# Patient Record
Sex: Female | Born: 1940 | Race: White | Hispanic: No | Marital: Married
Health system: Southern US, Community
[De-identification: ages and names within clinical notes are randomized; demographics above are authoritative.]

## PROBLEM LIST (undated history)

## (undated) DIAGNOSIS — J939 Pneumothorax, unspecified: Secondary | ICD-10-CM

## (undated) DIAGNOSIS — J9621 Acute and chronic respiratory failure with hypoxia: Secondary | ICD-10-CM

## (undated) DIAGNOSIS — I482 Chronic atrial fibrillation, unspecified: Secondary | ICD-10-CM

## (undated) DIAGNOSIS — I5032 Chronic diastolic (congestive) heart failure: Secondary | ICD-10-CM

## (undated) DIAGNOSIS — I7101 Dissection of ascending aorta: Secondary | ICD-10-CM

---

## 2020-02-09 ENCOUNTER — Inpatient Hospital Stay
Admit: 2020-02-09 | Discharge: 2020-03-09 | Disposition: A | Discharge status: Rehabilitation Facility | Payer: Medicare Other | Source: Other Acute Inpatient Hospital | Attending: Internal Medicine | Admitting: Internal Medicine

## 2020-02-09 DIAGNOSIS — I5032 Chronic diastolic (congestive) heart failure: Secondary | ICD-10-CM | POA: Diagnosis present

## 2020-02-09 DIAGNOSIS — J9690 Respiratory failure, unspecified, unspecified whether with hypoxia or hypercapnia: Secondary | ICD-10-CM

## 2020-02-09 DIAGNOSIS — J9621 Acute and chronic respiratory failure with hypoxia: Secondary | ICD-10-CM | POA: Diagnosis present

## 2020-02-09 DIAGNOSIS — Z431 Encounter for attention to gastrostomy: Secondary | ICD-10-CM

## 2020-02-09 DIAGNOSIS — Z4659 Encounter for fitting and adjustment of other gastrointestinal appliance and device: Secondary | ICD-10-CM

## 2020-02-09 DIAGNOSIS — Z9911 Dependence on respirator [ventilator] status: Secondary | ICD-10-CM

## 2020-02-09 DIAGNOSIS — J90 Pleural effusion, not elsewhere classified: Secondary | ICD-10-CM

## 2020-02-09 DIAGNOSIS — I482 Chronic atrial fibrillation, unspecified: Secondary | ICD-10-CM | POA: Diagnosis present

## 2020-02-09 DIAGNOSIS — T85598A Other mechanical complication of other gastrointestinal prosthetic devices, implants and grafts, initial encounter: Secondary | ICD-10-CM

## 2020-02-09 DIAGNOSIS — J939 Pneumothorax, unspecified: Secondary | ICD-10-CM | POA: Diagnosis present

## 2020-02-09 DIAGNOSIS — J189 Pneumonia, unspecified organism: Secondary | ICD-10-CM

## 2020-02-09 DIAGNOSIS — I7101 Dissection of ascending aorta: Secondary | ICD-10-CM | POA: Diagnosis present

## 2020-02-09 HISTORY — DX: Acute and chronic respiratory failure with hypoxia: J96.21

## 2020-02-09 HISTORY — DX: Chronic diastolic (congestive) heart failure: I50.32

## 2020-02-09 HISTORY — DX: Pneumothorax, unspecified: J93.9

## 2020-02-09 HISTORY — DX: Dissection of thoracic aorta: I71.01

## 2020-02-09 HISTORY — DX: Chronic atrial fibrillation, unspecified: I48.20

## 2020-02-09 HISTORY — DX: Dissection of ascending aorta: I71.010

## 2020-02-10 ENCOUNTER — Other Ambulatory Visit (HOSPITAL_COMMUNITY): Payer: Medicare Other

## 2020-02-10 DIAGNOSIS — I482 Chronic atrial fibrillation, unspecified: Secondary | ICD-10-CM | POA: Diagnosis not present

## 2020-02-10 DIAGNOSIS — I5032 Chronic diastolic (congestive) heart failure: Secondary | ICD-10-CM

## 2020-02-10 DIAGNOSIS — J9621 Acute and chronic respiratory failure with hypoxia: Secondary | ICD-10-CM | POA: Diagnosis not present

## 2020-02-10 DIAGNOSIS — J939 Pneumothorax, unspecified: Secondary | ICD-10-CM

## 2020-02-10 DIAGNOSIS — I7101 Dissection of thoracic aorta: Secondary | ICD-10-CM

## 2020-02-10 LAB — URINALYSIS, ROUTINE W REFLEX MICROSCOPIC
Bilirubin Urine: NEGATIVE
Glucose, UA: NEGATIVE mg/dL
Ketones, ur: NEGATIVE mg/dL
Nitrite: NEGATIVE
Protein, ur: 30 mg/dL — AB
Specific Gravity, Urine: 1.019 (ref 1.005–1.030)
WBC, UA: >50 WBC/hpf — ABNORMAL HIGH (ref 0–5)
pH: 5 (ref 5.0–8.0)

## 2020-02-10 LAB — BASIC METABOLIC PANEL
Anion gap: 15 (ref 5–15)
BUN: 49 mg/dL — ABNORMAL HIGH (ref 8–23)
CO2: 35 mmol/L — ABNORMAL HIGH (ref 22–32)
Calcium: 9 mg/dL (ref 8.9–10.3)
Chloride: 94 mmol/L — ABNORMAL LOW (ref 98–111)
Creatinine, Ser: 0.84 mg/dL (ref 0.44–1.00)
GFR, Estimated: >60 mL/min (ref >60)
Glucose, Bld: 95 mg/dL (ref 70–99)
Potassium: 4.4 mmol/L (ref 3.5–5.1)
Sodium: 144 mmol/L (ref 135–145)

## 2020-02-10 LAB — BLOOD GAS, ARTERIAL
Acid-Base Excess: 11.5 mmol/L — ABNORMAL HIGH (ref 0.0–2.0)
Acid-Base Excess: 14.3 mmol/L — ABNORMAL HIGH (ref 0.0–2.0)
Acid-Base Excess: 13 mmol/L — ABNORMAL HIGH (ref 0.0–2.0)
Acid-Base Excess: 13.3 mmol/L — ABNORMAL HIGH (ref 0.0–2.0)
Bicarbonate: 38.1 mmol/L — ABNORMAL HIGH (ref 20.0–28.0)
Bicarbonate: 38.6 mmol/L — ABNORMAL HIGH (ref 20.0–28.0)
Bicarbonate: 36.5 mmol/L — ABNORMAL HIGH (ref 20.0–28.0)
Bicarbonate: 36.8 mmol/L — ABNORMAL HIGH (ref 20.0–28.0)
FIO2: 80
FIO2: 50
FIO2: 50
FIO2: 50
O2 Saturation: 99.8 %
O2 Saturation: 97.7 %
O2 Saturation: 98.8 %
O2 Saturation: 98.8 %
Patient temperature: 35.6
Patient temperature: 36.8
Patient temperature: 37.6
Patient temperature: 38
pCO2 arterial: 74.3 mmHg (ref 32.0–48.0)
pCO2 arterial: 48.2 mmHg — ABNORMAL HIGH (ref 32.0–48.0)
pCO2 arterial: 41.3 mmHg (ref 32.0–48.0)
pCO2 arterial: 42 mmHg (ref 32.0–48.0)
pH, Arterial: 7.322 — ABNORMAL LOW (ref 7.350–7.450)
pH, Arterial: 7.514 — ABNORMAL HIGH (ref 7.350–7.450)
pH, Arterial: 7.557 — ABNORMAL HIGH (ref 7.350–7.450)
pH, Arterial: 7.555 — ABNORMAL HIGH (ref 7.350–7.450)
pO2, Arterial: 246 mmHg — ABNORMAL HIGH (ref 83.0–108.0)
pO2, Arterial: 76.9 mmHg — ABNORMAL LOW (ref 83.0–108.0)
pO2, Arterial: 99.7 mmHg (ref 83.0–108.0)
pO2, Arterial: 105 mmHg (ref 83.0–108.0)

## 2020-02-10 LAB — TROPONIN I (HIGH SENSITIVITY): Troponin I (High Sensitivity): 32 ng/L — ABNORMAL HIGH (ref <18)

## 2020-02-10 LAB — CBC
HCT: 29.7 % — ABNORMAL LOW (ref 36.0–46.0)
Hemoglobin: 9 g/dL — ABNORMAL LOW (ref 12.0–15.0)
MCH: 29.2 pg (ref 26.0–34.0)
MCHC: 30.3 g/dL (ref 30.0–36.0)
MCV: 96.4 fL (ref 80.0–100.0)
Platelets: 282 10*3/uL (ref 150–400)
RBC: 3.08 MIL/uL — ABNORMAL LOW (ref 3.87–5.11)
RDW: 17.2 % — ABNORMAL HIGH (ref 11.5–15.5)
WBC: 9 10*3/uL (ref 4.0–10.5)
nRBC: 0 % (ref 0.0–0.2)

## 2020-02-10 NOTE — Consult Note (Signed)
Pulmonary Critical Care Medicine [facility] [place]  PULMONARY SERVICE  Date of Service: 02/10/2020  PULMONARY CRITICAL CARE CONSULT   Kristine Herrera  XBD:[id number]  DOB: 07/18/1940   DOA: 02/09/2020  Referring Physician: Carron Curie, MD  HPI: Kristine Herrera is a 80 y.o. female seen for follow up of Acute on Chronic Respiratory Failure.  Patient has multiple medical problems including atrial fibrillation pneumothorax acute renal failure hypertension came into the hospital after complaint of syncope.  Patient was evaluated and found to have a type a dissection of the aorta.  Patient underwent repair on December 8 postoperatively had a very complicated course by development of cardiogenic shock requiring ongoing vasopressors.  Patient was not able to come off of the ventilator he was noted to have pleural effusions and edema and pneumothorax requiring a chest tube.  Subsequently patient had a tracheostomy done for anticipated prolonged mechanical ventilation.  Other complications included development of atrial fibrillation which is now rate controlled.  She is transferred to our facility for further management and weaning.  Review of Systems:  ROS performed and is unremarkable other than noted above.  Past medical history: Hypertension Delirium Aortic dissection Pleural effusions Pneumothorax Atrial fibrillation  Past surgical history: Aortic repair Tracheostomy  Social history unknown tobacco alcohol or drug abuse  Family history: Noncontributory  Medications: Reviewed on Rounds  Physical Exam:  Vitals: Temperature 98.2 pulse 78 respiratory rate 21 blood pressure is 121/59 saturations 99%  Ventilator Settings currently on assist control FiO2 50% tidal volume 500 PEEP of 5  . General: Comfortable at this time . Eyes: Grossly normal lids, irises & conjunctiva . ENT: grossly tongue is normal . Neck: no obvious mass . Cardiovascular: S1-S2 normal no gallop or  rub . Respiratory: No rhonchi no rales noted at this time . Abdomen: Soft and nontender . Skin: no rash seen on limited exam . Musculoskeletal: not rigid . Psychiatric:unable to assess . Neurologic: no seizure no involuntary movements         Labs on Admission:  Basic Metabolic Panel: Recent Labs  Lab 02/10/20 0541  NA 144  K 4.4  CL 94*  CO2 35*  GLUCOSE 95  BUN 49*  CREATININE 0.84  CALCIUM 9.0    Recent Labs  Lab 02/10/20 0330 02/10/20 0550 02/10/20 0834 02/10/20 1407  PHART 7.322* 7.514* 7.557* 7.555*  PCO2ART 74.3* 48.2* 41.3 42.0  PO2ART 246* 76.9* 99.7 105  HCO3 38.1* 38.6* 36.5* 36.8*  O2SAT 99.8 97.7 98.8 98.8    Liver Function Tests: No results for input(s): AST, ALT, ALKPHOS, BILITOT, PROT, ALBUMIN in the last 168 hours. No results for input(s): LIPASE, AMYLASE in the last 168 hours. No results for input(s): AMMONIA in the last 168 hours.  CBC: Recent Labs  Lab 02/10/20 0541  WBC 9.0  HGB 9.0*  HCT 29.7*  MCV 96.4  PLT 282    Cardiac Enzymes: No results for input(s): CKTOTAL, CKMB, CKMBINDEX, TROPONINI in the last 168 hours.  BNP (last 3 results) No results for input(s): BNP in the last 8760 hours.  ProBNP (last 3 results) No results for input(s): PROBNP in the last 8760 hours.   Radiological Exams on Admission: DG Chest 1 View  Result Date: 02/10/2020 CLINICAL DATA:  Respiratory failure, decreased oxygen saturation EXAM: CHEST  1 VIEW COMPARISON:  None. FINDINGS: Single frontal view of the chest demonstrates tracheostomy tube overlying tracheal air column tip at thoracic inlet. Enteric catheter passes below diaphragm tip excluded by collimation.  The cardiac silhouette is enlarged. There is central vascular congestion, with right basilar veiling opacity consistent with consolidation and/or effusion. No pneumothorax. No acute bony abnormalities. IMPRESSION: 1. Findings consistent with congestive heart failure, with asymmetric right greater  than left edema and effusions. Electronically Signed   By: Sharlet Salina M.D.   On: 02/10/2020 03:20   DG Abd Portable 1V  Result Date: 02/10/2020 CLINICAL DATA:  Enteric catheter placement EXAM: PORTABLE ABDOMEN - 1 VIEW COMPARISON:  None. FINDINGS: Supine frontal view of the abdomen and pelvis excludes the hemidiaphragms by collimation. Enteric catheter coiled over the the distal stomach, tip in the region of the gastric antrum or proximal duodenum. Bowel gas pattern is unremarkable without obstruction or ileus. There are no masses or abnormal calcifications. IMPRESSION: 1. Enteric catheter tip projecting over the gastric antrum or duodenal bulb. 2. Unremarkable bowel gas pattern. Electronically Signed   By: Sharlet Salina M.D.   On: 02/10/2020 01:31    Assessment/Plan Active Problems:   Acute on chronic respiratory failure with hypoxia (HCC)   Chronic atrial fibrillation (HCC)   Pneumothorax   Chronic heart failure with preserved ejection fraction (HCC)   Ascending aortic dissection (HCC)   1. Acute on chronic respiratory failure with hypoxia we are holding the wean today patient apparently had a rapid response early on cardiology has been asked to see the patient for atrial fibrillation. 2. Atrial fibrillation with RVR cardiology asked to see the patient will follow up on their recommendations 3. Aortic dissection status postrepair 4. Pneumothorax resolved patient had chest tube placement 5. Congestive heart failure monitor fluid status will need diuresis  I have personally seen and evaluated the patient, evaluated laboratory and imaging results, formulated the assessment and plan and placed orders. The Patient requires high complexity decision making with multiple systems involvement.  Case was discussed on Rounds with the Respiratory Therapy Director and the Respiratory staff Time Spent  Yevonne Pax, MD [facility] Pulmonary Critical Care Medicine Sleep Medicine

## 2020-02-10 NOTE — Consult Note (Signed)
Referring Physician: Dr. Farrel Gordon  Kristine Herrera is an 80 y.o. female.                       Chief Complaint: Atiral fibrillation with RVR  HPI: 80 years old female had syncope, T1 burst fracture, nasal fractures and type A aortic dissection on 01/12/2020. She had emergent repair of type A aortic dissection. Post surgery she had cardiogenic shock requiring intubation followed by tracheostomy placed on 01/21/2020. She had atrial fibrillation with RVR and responded to amiodarone and metoprolol use.  Currently she is back in Sinus rhythm with frequent APCs.  She is on trach collar with 50 % FiO2 and 18 AV, 500 cc TV and 5 PEEP. Anticoagulation was on hold per cardiothoracic surgery.  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  Past surgical history: Cholecystectomy. 01/12/2020-Replacement of ascending aorta and hemiarch with re-suspension of AV, Clipping of LA appendage. Tracheostomy-01/21/2020.   The histories are not reviewed yet. Please review them in the "History" navigator section and refresh this SmartLink.  No family history on file. Social History:  has no history on file for tobacco use, alcohol use, and drug use.  Allergies: None  No medications prior to admission.  Amiodarone, metoprolol, aspirin, melatonin.   Results for orders placed or performed during the hospital encounter of 02/09/20 (from the past 48 hour(s))  Blood gas, arterial     Status: Abnormal   Collection Time: 02/10/20  3:30 AM  Result Value Ref Range   FIO2 80.00    pH, Arterial 7.322 (L) 7.350 - 7.450   pCO2 arterial 74.3 (HH) 32.0 - 48.0 mmHg    Comment: CRITICAL RESULT CALLED TO, READ BACK BY AND VERIFIED WITH: C HERNADEZ,RN 02/10/2020 0346 WILDERK    pO2, Arterial 246 (H) 83.0 - 108.0 mmHg   Bicarbonate 38.1 (H) 20.0 - 28.0 mmol/L   Acid-Base Excess 11.5 (H) 0.0 - 2.0 mmol/L   O2 Saturation 99.8  %   Patient temperature 35.6    Collection site RIGHT RADIAL    Drawn by DRAWN BY RN     Comment: C HE   Sample type ARTERIAL DRAW    Allens test (pass/fail) PASS PASS    Comment: Performed at [facility], [address] [place], [place] [address]  CBC     Status: Abnormal   Collection Time: 02/10/20  5:41 AM  Result Value Ref Range   WBC 9.0 4.0 - 10.5 K/uL   RBC 3.08 (L) 3.87 - 5.11 MIL/uL   Hemoglobin 9.0 (L) 12.0 - 15.0 g/dL   HCT 19.1 (L) 47.8 - 29.5 %   MCV 96.4 80.0 - 100.0 fL   MCH 29.2 26.0 - 34.0 pg   MCHC 30.3 30.0 - 36.0 g/dL   RDW 62.1 (H) 30.8 - 65.7 %   Platelets 282 150 - 400 K/uL   nRBC 0.0 0.0 - 0.2 %    Comment: Performed at [facility], [address], [place], [place] [address]  Basic metabolic panel     Status: Abnormal   Collection Time: 02/10/20  5:41 AM  Result Value Ref Range   Sodium 144 135 - 145 mmol/L   Potassium 4.4 3.5 - 5.1 mmol/L   Chloride 94 (L) 98 - 111 mmol/L   CO2 35 (H) 22 - 32 mmol/L   Glucose, Bld 95 70 - 99 mg/dL  Comment: Glucose reference range applies only to samples taken after fasting for at least 8 hours.   BUN 49 (H) 8 - 23 mg/dL   Creatinine, Ser 3.66 0.44 - 1.00 mg/dL   Calcium 9.0 8.9 - 44.0 mg/dL   GFR, Estimated >34 >74 mL/min    Comment: (NOTE) Calculated using the CKD-EPI Creatinine Equation (2021)    Anion gap 15 5 - 15    Comment: Performed at [facility], [address], [place], [place] [address]  Troponin I (High Sensitivity)     Status: Abnormal   Collection Time: 02/10/20  5:41 AM  Result Value Ref Range   Troponin I (High Sensitivity) 32 (H) <18 ng/L    Comment: (NOTE) Elevated high sensitivity troponin I (hsTnI) values and significant  changes across serial measurements may suggest ACS but many other  chronic and acute conditions are known to elevate hsTnI results.  Refer to the "Links" section for chest pain algorithms and additional  guidance. Performed at [facility]  [facility], [address] [place], [place] [address]   Blood gas, arterial     Status: Abnormal   Collection Time: 02/10/20  5:50 AM  Result Value Ref Range   FIO2 50.00    pH, Arterial 7.514 (H) 7.350 - 7.450   pCO2 arterial 48.2 (H) 32.0 - 48.0 mmHg   pO2, Arterial 76.9 (L) 83.0 - 108.0 mmHg   Bicarbonate 38.6 (H) 20.0 - 28.0 mmol/L   Acid-Base Excess 14.3 (H) 0.0 - 2.0 mmol/L   O2 Saturation 97.7 %   Patient temperature 36.8    Collection site RIGHT RADIAL    Drawn by C. HERNANDEZ, RCP    Sample type ARTERIAL DRAW    Allens test (pass/fail) PASS PASS    Comment: Performed at [facility], [address], [place], [place] [address]  Blood gas, arterial     Status: Abnormal   Collection Time: 02/10/20  8:34 AM  Result Value Ref Range   FIO2 50.00    pH, Arterial 7.557 (H) 7.350 - 7.450   pCO2 arterial 41.3 32.0 - 48.0 mmHg   pO2, Arterial 99.7 83.0 - 108.0 mmHg   Bicarbonate 36.5 (H) 20.0 - 28.0 mmol/L   Acid-Base Excess 13.0 (H) 0.0 - 2.0 mmol/L   O2 Saturation 98.8 %   Patient temperature 37.6    Collection site RIGHT RADIAL    Drawn by COLLECTED BY RT     Comment: M.MANUEL,RT   Sample type ARTERIAL DRAW    Allens test (pass/fail) PASS PASS    Comment: Performed at [facility], [address], [place], [place] [address]   DG Chest 1 View  Result Date: 02/10/2020 CLINICAL DATA:  Respiratory failure, decreased oxygen saturation EXAM: CHEST  1 VIEW COMPARISON:  None. FINDINGS: Single frontal view of the chest demonstrates tracheostomy tube overlying tracheal air column tip at thoracic inlet. Enteric catheter passes below diaphragm tip excluded by collimation. The cardiac silhouette is enlarged. There is central vascular congestion, with right basilar veiling opacity consistent with consolidation and/or effusion. No pneumothorax. No acute bony abnormalities. IMPRESSION: 1. Findings consistent with congestive heart failure, with asymmetric right greater than left edema and  effusions. Electronically Signed   By: Sharlet Salina M.D.   On: 02/10/2020 03:20   DG Abd Portable 1V  Result Date: 02/10/2020 CLINICAL DATA:  Enteric catheter placement EXAM: PORTABLE ABDOMEN - 1 VIEW COMPARISON:  None. FINDINGS: Supine frontal view of the abdomen  and pelvis excludes the hemidiaphragms by collimation. Enteric catheter coiled over the the distal stomach, tip in the region of the gastric antrum or proximal duodenum. Bowel gas pattern is unremarkable without obstruction or ileus. There are no masses or abnormal calcifications. IMPRESSION: 1. Enteric catheter tip projecting over the gastric antrum or duodenal bulb. 2. Unremarkable bowel gas pattern. Electronically Signed   By: Randa Ngo M.D.   On: 02/10/2020 01:31    Review Of Systems As per HPI  P: 83, R: 22, BP: 123/44. O2 sat 100 % on 50 % FiO2, 500 TV, 18 A/C and 5 PEEP. There is no height or weight on file to calculate BMI. General appearance: awake, cooperative, appears stated age and mild respiratory distress Head: Normocephalic, atraumatic. Eyes: Blue eyes, pale pink conjunctiva, corneas clear.  Neck: No adenopathy, no carotid bruit, no JVD, supple, symmetrical, tracheostomy in place. Resp: Clearing to auscultation bilaterally. Cardio: Irregular rate and rhythm, S1, S2 normal, II/VI systolic murmur, no click, rub or gallop GI: Soft, non-tender; bowel sounds normal; no organomegaly. Extremities: No edema, cyanosis or clubbing. Left 1st, 2nd, 3rd and 5th toe tips with gangrene. Skin: Warm and dry.  Neurologic: Alert and oriented X 1.  Assessment/Plan Acute on chronic respiratory failure with hypoxia Paroxysmal atrial fibrillation with RVR, [id number] score of 6 S/P type A aortic dissection with repair S/P LA appendage clip HTN Hyperlipidemia Nasal fracture T1 fracture PVD with Left foot toes gangrene  Continue amiodarone and low dose metoprolol for heart rate control. Echocardiogram for LV function. Accept  low dose aspirin and possibly add Plavix for antiplatelets therapy if full anticoagulation is not safe.  Time spent: Review of old records, Lab, x-rays, EKG, other cardiac tests, examination, discussion with patient over 70 minutes.  Birdie Riddle, MD  02/10/2020, 9:50 AM

## 2020-02-11 ENCOUNTER — Encounter: Payer: Self-pay | Admitting: Internal Medicine

## 2020-02-11 ENCOUNTER — Other Ambulatory Visit (HOSPITAL_COMMUNITY): Payer: Medicare Other

## 2020-02-11 DIAGNOSIS — I482 Chronic atrial fibrillation, unspecified: Secondary | ICD-10-CM | POA: Diagnosis not present

## 2020-02-11 DIAGNOSIS — I5032 Chronic diastolic (congestive) heart failure: Secondary | ICD-10-CM | POA: Diagnosis present

## 2020-02-11 DIAGNOSIS — J9621 Acute and chronic respiratory failure with hypoxia: Secondary | ICD-10-CM | POA: Diagnosis not present

## 2020-02-11 DIAGNOSIS — I7101 Dissection of ascending aorta: Secondary | ICD-10-CM | POA: Diagnosis present

## 2020-02-11 DIAGNOSIS — J939 Pneumothorax, unspecified: Secondary | ICD-10-CM | POA: Diagnosis present

## 2020-02-11 LAB — COMPREHENSIVE METABOLIC PANEL
ALT: 26 U/L (ref 0–44)
AST: 27 U/L (ref 15–41)
Albumin: 2.6 g/dL — ABNORMAL LOW (ref 3.5–5.0)
Alkaline Phosphatase: 81 U/L (ref 38–126)
Anion gap: 13 (ref 5–15)
BUN: 50 mg/dL — ABNORMAL HIGH (ref 8–23)
CO2: 35 mmol/L — ABNORMAL HIGH (ref 22–32)
Calcium: 8.6 mg/dL — ABNORMAL LOW (ref 8.9–10.3)
Chloride: 96 mmol/L — ABNORMAL LOW (ref 98–111)
Creatinine, Ser: 0.89 mg/dL (ref 0.44–1.00)
GFR, Estimated: >60 mL/min (ref >60)
Glucose, Bld: 160 mg/dL — ABNORMAL HIGH (ref 70–99)
Potassium: 2.8 mmol/L — ABNORMAL LOW (ref 3.5–5.1)
Sodium: 144 mmol/L (ref 135–145)
Total Bilirubin: 1.1 mg/dL (ref 0.3–1.2)
Total Protein: 6.6 g/dL (ref 6.5–8.1)

## 2020-02-11 LAB — TSH: TSH: 6.82 u[IU]/mL — ABNORMAL HIGH (ref 0.350–4.500)

## 2020-02-11 LAB — CBC
HCT: 26.6 % — ABNORMAL LOW (ref 36.0–46.0)
Hemoglobin: 8.3 g/dL — ABNORMAL LOW (ref 12.0–15.0)
MCH: 29.1 pg (ref 26.0–34.0)
MCHC: 31.2 g/dL (ref 30.0–36.0)
MCV: 93.3 fL (ref 80.0–100.0)
Platelets: 286 10*3/uL (ref 150–400)
RBC: 2.85 MIL/uL — ABNORMAL LOW (ref 3.87–5.11)
RDW: 18.1 % — ABNORMAL HIGH (ref 11.5–15.5)
WBC: 8.9 10*3/uL (ref 4.0–10.5)
nRBC: 0 % (ref 0.0–0.2)

## 2020-02-11 LAB — MAGNESIUM: Magnesium: 2.5 mg/dL — ABNORMAL HIGH (ref 1.7–2.4)

## 2020-02-11 LAB — ECHOCARDIOGRAM COMPLETE
Area-P 1/2: 4.68 cm2
P 1/2 time: 309 msec
S' Lateral: 2.6 cm

## 2020-02-11 LAB — BRAIN NATRIURETIC PEPTIDE: B Natriuretic Peptide: 254.8 pg/mL — ABNORMAL HIGH (ref 0.0–100.0)

## 2020-02-11 LAB — HEMOGLOBIN A1C
Hgb A1c MFr Bld: 5.6 % (ref 4.8–5.6)
Mean Plasma Glucose: 114.02 mg/dL

## 2020-02-11 LAB — T4, FREE: Free T4: 1.24 ng/dL — ABNORMAL HIGH (ref 0.61–1.12)

## 2020-02-11 LAB — PHOSPHORUS: Phosphorus: 1.9 mg/dL — ABNORMAL LOW (ref 2.5–4.6)

## 2020-02-11 NOTE — Progress Notes (Signed)
Pulmonary Critical Care Medicine [facility] [place]   PULMONARY CRITICAL CARE SERVICE  PROGRESS NOTE  Date of Service: 02/11/2020  Kristine Herrera  ONG:[id number]  DOB: 03-Aug-1940   DOA: 02/09/2020  Referring Physician: Carron Curie, MD  HPI: Kristine Herrera is a 80 y.o. female seen for follow up of Acute on Chronic Respiratory Failure.  Patient currently is on pressure support has been on 40% of that to pressure of 12 5  Medications: Reviewed on Rounds  Physical Exam:  Vitals: Temperature is 98.7 pulse 69 history 20 blood pressure is 129/70 saturations 97%  Ventilator Settings currently on pressure support FiO2 is 40% pressure of 12/5  . General: Comfortable at this time . Eyes: Grossly normal lids, irises & conjunctiva . ENT: grossly tongue is normal . Neck: no obvious mass . Cardiovascular: S1 S2 normal no gallop . Respiratory: No rhonchi very coarse breath sounds . Abdomen: soft . Skin: no rash seen on limited exam . Musculoskeletal: not rigid . Psychiatric:unable to assess . Neurologic: no seizure no involuntary movements         Lab Data:   Basic Metabolic Panel: Recent Labs  Lab 02/10/20 0541 02/11/20 0342  NA 144 144  K 4.4 2.8*  CL 94* 96*  CO2 35* 35*  GLUCOSE 95 160*  BUN 49* 50*  CREATININE 0.84 0.89  CALCIUM 9.0 8.6*  MG  --  2.5*  PHOS  --  1.9*    ABG: Recent Labs  Lab 02/10/20 0330 02/10/20 0550 02/10/20 0834 02/10/20 1407  PHART 7.322* 7.514* 7.557* 7.555*  PCO2ART 74.3* 48.2* 41.3 42.0  PO2ART 246* 76.9* 99.7 105  HCO3 38.1* 38.6* 36.5* 36.8*  O2SAT 99.8 97.7 98.8 98.8    Liver Function Tests: Recent Labs  Lab 02/11/20 0342  AST 27  ALT 26  ALKPHOS 81  BILITOT 1.1  PROT 6.6  ALBUMIN 2.6*   No results for input(s): LIPASE, AMYLASE in the last 168 hours. No results for input(s): AMMONIA in the last 168 hours.  CBC: Recent Labs  Lab 02/10/20 0541 02/11/20 0342  WBC 9.0 8.9  HGB 9.0* 8.3*  HCT 29.7*  26.6*  MCV 96.4 93.3  PLT 282 286    Cardiac Enzymes: No results for input(s): CKTOTAL, CKMB, CKMBINDEX, TROPONINI in the last 168 hours.  BNP (last 3 results) Recent Labs    02/11/20 0342  BNP 254.8*    ProBNP (last 3 results) No results for input(s): PROBNP in the last 8760 hours.  Radiological Exams: DG Chest 1 View  Result Date: 02/10/2020 CLINICAL DATA:  Respiratory failure, decreased oxygen saturation EXAM: CHEST  1 VIEW COMPARISON:  None. FINDINGS: Single frontal view of the chest demonstrates tracheostomy tube overlying tracheal air column tip at thoracic inlet. Enteric catheter passes below diaphragm tip excluded by collimation. The cardiac silhouette is enlarged. There is central vascular congestion, with right basilar veiling opacity consistent with consolidation and/or effusion. No pneumothorax. No acute bony abnormalities. IMPRESSION: 1. Findings consistent with congestive heart failure, with asymmetric right greater than left edema and effusions. Electronically Signed   By: Sharlet Salina M.D.   On: 02/10/2020 03:20   DG Abd Portable 1V  Result Date: 02/10/2020 CLINICAL DATA:  Enteric catheter placement EXAM: PORTABLE ABDOMEN - 1 VIEW COMPARISON:  None. FINDINGS: Supine frontal view of the abdomen and pelvis excludes the hemidiaphragms by collimation. Enteric catheter coiled over the the distal stomach, tip in the region of the gastric antrum or proximal duodenum. Bowel gas  pattern is unremarkable without obstruction or ileus. There are no masses or abnormal calcifications. IMPRESSION: 1. Enteric catheter tip projecting over the gastric antrum or duodenal bulb. 2. Unremarkable bowel gas pattern. Electronically Signed   By: Sharlet Salina M.D.   On: 02/10/2020 01:31    Assessment/Plan Active Problems:   Acute on chronic respiratory failure with hypoxia (HCC)   Chronic atrial fibrillation (HCC)   Pneumothorax   Chronic heart failure with preserved ejection fraction (HCC)    Ascending aortic dissection (HCC)   1. Acute on chronic respiratory failure hypoxia plan is to continue with pressure support titrate oxygen continue pulmonary toilet. 2. Chronic atrial fibrillation rate 3. Rate is controlled we will continue to follow 4. Pneumothorax resolved 5. Chronic heart failure monitor fluid status 6. Ascending aortic dissection status postrepair   I have personally seen and evaluated the patient, evaluated laboratory and imaging results, formulated the assessment and plan and placed orders. The Patient requires high complexity decision making with multiple systems involvement.  Rounds were done with the Respiratory Therapy Director and Staff therapists and discussed with nursing staff also.  Yevonne Pax, MD [facility] Pulmonary Critical Care Medicine Sleep Medicine

## 2020-02-11 NOTE — Progress Notes (Signed)
  Echocardiogram 2D Echocardiogram has been performed.  Kristine Herrera 02/11/2020, 3:30 PM

## 2020-02-12 ENCOUNTER — Other Ambulatory Visit (HOSPITAL_COMMUNITY): Payer: Medicare Other

## 2020-02-12 DIAGNOSIS — I7101 Dissection of thoracic aorta: Secondary | ICD-10-CM | POA: Diagnosis not present

## 2020-02-12 DIAGNOSIS — I5032 Chronic diastolic (congestive) heart failure: Secondary | ICD-10-CM | POA: Diagnosis not present

## 2020-02-12 DIAGNOSIS — I482 Chronic atrial fibrillation, unspecified: Secondary | ICD-10-CM | POA: Diagnosis not present

## 2020-02-12 DIAGNOSIS — J9621 Acute and chronic respiratory failure with hypoxia: Secondary | ICD-10-CM | POA: Diagnosis not present

## 2020-02-12 LAB — CBC
HCT: 28 % — ABNORMAL LOW (ref 36.0–46.0)
Hemoglobin: 8.3 g/dL — ABNORMAL LOW (ref 12.0–15.0)
MCH: 28.4 pg (ref 26.0–34.0)
MCHC: 29.6 g/dL — ABNORMAL LOW (ref 30.0–36.0)
MCV: 95.9 fL (ref 80.0–100.0)
Platelets: 230 10*3/uL (ref 150–400)
RBC: 2.92 MIL/uL — ABNORMAL LOW (ref 3.87–5.11)
RDW: 18.6 % — ABNORMAL HIGH (ref 11.5–15.5)
WBC: 10 10*3/uL (ref 4.0–10.5)
nRBC: 0 % (ref 0.0–0.2)

## 2020-02-12 LAB — BASIC METABOLIC PANEL
Anion gap: 11 (ref 5–15)
BUN: 41 mg/dL — ABNORMAL HIGH (ref 8–23)
CO2: 34 mmol/L — ABNORMAL HIGH (ref 22–32)
Calcium: 8.2 mg/dL — ABNORMAL LOW (ref 8.9–10.3)
Chloride: 99 mmol/L (ref 98–111)
Creatinine, Ser: 0.68 mg/dL (ref 0.44–1.00)
GFR, Estimated: >60 mL/min (ref >60)
Glucose, Bld: 149 mg/dL — ABNORMAL HIGH (ref 70–99)
Potassium: 3.3 mmol/L — ABNORMAL LOW (ref 3.5–5.1)
Sodium: 144 mmol/L (ref 135–145)

## 2020-02-12 LAB — CULTURE, RESPIRATORY W GRAM STAIN: Culture: NORMAL

## 2020-02-12 LAB — PHOSPHORUS: Phosphorus: 2.4 mg/dL — ABNORMAL LOW (ref 2.5–4.6)

## 2020-02-12 LAB — MAGNESIUM: Magnesium: 2.4 mg/dL (ref 1.7–2.4)

## 2020-02-12 NOTE — Progress Notes (Signed)
Pulmonary Critical Care Medicine [facility] [place]   PULMONARY CRITICAL CARE SERVICE  PROGRESS NOTE  Date of Service: 02/12/2020  DORENE BRUNI  URK:[id number]  DOB: [birth year]   DOA: 02/09/2020  Referring Physician: Carron Curie, MD  HPI: Kristine Herrera is a 80 y.o. female seen for follow up of Acute on Chronic Respiratory Failure.  Patient is on full support on assist control mode right now on 40% FiO2 yesterday patient was able to do 11 hours with pressure support weaning  Medications: Reviewed on Rounds  Physical Exam:  Vitals: Temperature is 98.5 pulse 71 respiratory rate 28 blood pressure is 140/74 saturations 100%  Ventilator Settings on assist control FiO2 40% tidal volume 450 PEEP 5  . General: Comfortable at this time . Eyes: Grossly normal lids, irises & conjunctiva . ENT: grossly tongue is normal . Neck: no obvious mass . Cardiovascular: S1 S2 normal no gallop . Respiratory: Coarse rhonchi expansion is equal . Abdomen: soft . Skin: no rash seen on limited exam . Musculoskeletal: not rigid . Psychiatric:unable to assess . Neurologic: no seizure no involuntary movements         Lab Data:   Basic Metabolic Panel: Recent Labs  Lab 02/10/20 0541 02/11/20 0342 02/12/20 0513  NA 144 144 144  K 4.4 2.8* 3.3*  CL 94* 96* 99  CO2 35* 35* 34*  GLUCOSE 95 160* 149*  BUN 49* 50* 41*  CREATININE 0.84 0.89 0.68  CALCIUM 9.0 8.6* 8.2*  MG  --  2.5* 2.4  PHOS  --  1.9* 2.4*    ABG: Recent Labs  Lab 02/10/20 0330 02/10/20 0550 02/10/20 0834 02/10/20 1407  PHART 7.322* 7.514* 7.557* 7.555*  PCO2ART 74.3* 48.2* 41.3 42.0  PO2ART 246* 76.9* 99.7 105  HCO3 38.1* 38.6* 36.5* 36.8*  O2SAT 99.8 97.7 98.8 98.8    Liver Function Tests: Recent Labs  Lab 02/11/20 0342  AST 27  ALT 26  ALKPHOS 81  BILITOT 1.1  PROT 6.6  ALBUMIN 2.6*   No results for input(s): LIPASE, AMYLASE in the last 168 hours. No results for input(s): AMMONIA in the  last 168 hours.  CBC: Recent Labs  Lab 02/10/20 0541 02/11/20 0342 02/12/20 0513  WBC 9.0 8.9 10.0  HGB 9.0* 8.3* 8.3*  HCT 29.7* 26.6* 28.0*  MCV 96.4 93.3 95.9  PLT 282 286 230    Cardiac Enzymes: No results for input(s): CKTOTAL, CKMB, CKMBINDEX, TROPONINI in the last 168 hours.  BNP (last 3 results) Recent Labs    02/11/20 0342  BNP 254.8*    ProBNP (last 3 results) No results for input(s): PROBNP in the last 8760 hours.  Radiological Exams: DG CHEST PORT 1 VIEW  Result Date: 02/12/2020 CLINICAL DATA:  Pneumonia EXAM: PORTABLE CHEST 1 VIEW COMPARISON:  02/10/2020 FINDINGS: Tracheostomy and nasoenteric feeding tube extending into the upper abdomen beyond the margin of the examination are unchanged. Pulmonary insufflation is stable. Small right pleural effusion is again seen. Progressive left lower lobe collapse with resultant retrocardiac opacification noted. There is progressive diffuse pulmonary infiltrate, infection versus edema. IMPRESSION: Progressive left lower lobe collapse. Progressive diffuse pulmonary infiltrate, infection versus edema. Stable small right pleural effusion. Electronically Signed   By: Helyn Numbers MD   On: 02/12/2020 06:24   ECHOCARDIOGRAM COMPLETE  Result Date: 02/11/2020    ECHOCARDIOGRAM REPORT   Patient Name:   JORDANN GRIME Panozzo Date of Exam: 02/11/2020 Medical Rec #:  [id number]  Height: Accession #:    [id number]       Weight: Date of Birth:  09-20-1940        BSA: Patient Age:    4 years         BP:           129/70 mmHg Patient Gender: F                HR:           69 bpm. Exam Location:  Inpatient Procedure: 2D Echo, Cardiac Doppler and Color Doppler Indications:     CHF, atrial fibrillation  History:         Patient has no prior history of Echocardiogram examinations.                  CHF, Arrythmias:Atrial Fibrillation, Signs/Symptoms:Resp.                  failure; Risk Factors:Diabetes, Hypertension and Dyslipidemia.                   Type A aortic dissection repair, Trach collar.  Sonographer:     Dustin Flock RDCS Referring Phys:  [place] Diagnosing Phys: Dixie Dials MD IMPRESSIONS  1. Left ventricular ejection fraction, by estimation, is 60 to 65%. The left ventricle has normal function. The left ventricle has no regional wall motion abnormalities. There is mild concentric left ventricular hypertrophy. Left ventricular diastolic parameters are indeterminate.  2. Right ventricular systolic function is low normal. The right ventricular size is normal. There is mildly elevated pulmonary artery systolic pressure.  3. Left atrial size was moderately dilated.  4. Right atrial size was mildly dilated.  5. The mitral valve is degenerative. Mild mitral valve regurgitation.  6. Tricuspid valve regurgitation is moderate.  7. The aortic valve is tricuspid. There is mild calcification of the aortic valve. There is mild thickening of the aortic valve. Aortic valve regurgitation is mild.  8. The inferior vena cava is dilated in size with <50% respiratory variability, suggesting right atrial pressure of 15 mmHg. FINDINGS  Left Ventricle: Left ventricular ejection fraction, by estimation, is 60 to 65%. The left ventricle has normal function. The left ventricle has no regional wall motion abnormalities. The left ventricular internal cavity size was normal in size. There is  mild concentric left ventricular hypertrophy. Left ventricular diastolic parameters are indeterminate. Right Ventricle: The right ventricular size is normal. No increase in right ventricular wall thickness. Right ventricular systolic function is low normal. There is mildly elevated pulmonary artery systolic pressure. The tricuspid regurgitant velocity is 3.11 m/s, and with an assumed right atrial pressure of 3 mmHg, the estimated right ventricular systolic pressure is 56.3 mmHg. Left Atrium: Left atrial size was moderately dilated. Right Atrium: Right atrial size was mildly  dilated. Pericardium: There is no evidence of pericardial effusion. Mitral Valve: The mitral valve is degenerative in appearance. There is mild thickening of the mitral valve leaflet(s). There is mild calcification of the mitral valve leaflet(s). Mild mitral annular calcification. Mild mitral valve regurgitation. Tricuspid Valve: The tricuspid valve is normal in structure. Tricuspid valve regurgitation is moderate. Aortic Valve: The aortic valve is tricuspid. There is mild calcification of the aortic valve. There is mild thickening of the aortic valve. There is mild aortic valve annular calcification. Aortic valve regurgitation is mild. Aortic regurgitation PHT measures 309 msec. Pulmonic Valve: The pulmonic valve was normal in structure. Pulmonic valve regurgitation is mild. Aorta: The  aortic root is normal in size and structure. There is minimal (Grade I) atheroma plaque involving the aortic root and ascending aorta. Venous: The inferior vena cava is dilated in size with less than 50% respiratory variability, suggesting right atrial pressure of 15 mmHg. IAS/Shunts: The interatrial septum was not assessed.  LEFT VENTRICLE PLAX 2D LVIDd:         4.10 cm  Diastology LVIDs:         2.60 cm  LV e' medial:    5.11 cm/s LV PW:         1.20 cm  LV E/e' medial:  21.9 LV IVS:        1.30 cm  LV e' lateral:   6.85 cm/s LVOT diam:     2.30 cm  LV E/e' lateral: 16.4 LV SV:         138 LVOT Area:     4.15 cm  RIGHT VENTRICLE RV Basal diam:  3.20 cm RV S prime:     9.03 cm/s TAPSE (M-mode): 2.6 cm LEFT ATRIUM             RIGHT ATRIUM LA diam:        4.40 cm RA Area:     16.30 cm LA Vol (A2C):   52.1 ml RA Volume:   41.00 ml LA Vol (A4C):   83.9 ml LA Biplane Vol: 69.6 ml  AORTIC VALVE LVOT Vmax:   135.00 cm/s LVOT Vmean:  91.700 cm/s LVOT VTI:    0.331 m AI PHT:      309 msec  AORTA Ao Root diam: 3.00 cm MITRAL VALVE                TRICUSPID VALVE MV Area (PHT): 4.68 cm     TR Peak grad:   38.7 mmHg MV Decel Time: 162 msec      TR Vmax:        311.00 cm/s MV E velocity: 112.00 cm/s MV A velocity: 38.30 cm/s   SHUNTS MV E/A ratio:  2.92         Systemic VTI:  0.33 m                             Systemic Diam: 2.30 cm Orpah Cobb MD Electronically signed by Orpah Cobb MD Signature Date/Time: 02/11/2020/4:28:14 PM    Final     Assessment/Plan Active Problems:   Acute on chronic respiratory failure with hypoxia (HCC)   Chronic atrial fibrillation (HCC)   Pneumothorax   Chronic heart failure with preserved ejection fraction (HCC)   Ascending aortic dissection (HCC)   1. Acute on chronic respiratory failure with hypoxia patient is going to continue to wean on pressure support today the goal will be 16 hours echocardiogram was done ejection fraction of 60 to 65% the RV function was low normal 2. Chronic atrial fibrillation rate is controlled we will continue to follow. 3. Pneumothorax we will continue to follow along. 4. Chronic heart failure preserved ejection fraction at baseline 5. Ascending aortic dissection no change   I have personally seen and evaluated the patient, evaluated laboratory and imaging results, formulated the assessment and plan and placed orders. The Patient requires high complexity decision making with multiple systems involvement.  Rounds were done with the Respiratory Therapy Director and Staff therapists and discussed with nursing staff also.  Yevonne Pax, MD [facility] Pulmonary Critical Care Medicine Sleep Medicine

## 2020-02-13 DIAGNOSIS — J9621 Acute and chronic respiratory failure with hypoxia: Secondary | ICD-10-CM | POA: Diagnosis not present

## 2020-02-13 DIAGNOSIS — I5032 Chronic diastolic (congestive) heart failure: Secondary | ICD-10-CM | POA: Diagnosis not present

## 2020-02-13 DIAGNOSIS — I482 Chronic atrial fibrillation, unspecified: Secondary | ICD-10-CM | POA: Diagnosis not present

## 2020-02-13 DIAGNOSIS — I7101 Dissection of thoracic aorta: Secondary | ICD-10-CM | POA: Diagnosis not present

## 2020-02-13 LAB — CBC
HCT: 28.9 % — ABNORMAL LOW (ref 36.0–46.0)
Hemoglobin: 8.2 g/dL — ABNORMAL LOW (ref 12.0–15.0)
MCH: 27.9 pg (ref 26.0–34.0)
MCHC: 28.4 g/dL — ABNORMAL LOW (ref 30.0–36.0)
MCV: 98.3 fL (ref 80.0–100.0)
Platelets: 217 10*3/uL (ref 150–400)
RBC: 2.94 MIL/uL — ABNORMAL LOW (ref 3.87–5.11)
RDW: 18.6 % — ABNORMAL HIGH (ref 11.5–15.5)
WBC: 13.8 10*3/uL — ABNORMAL HIGH (ref 4.0–10.5)
nRBC: 0 % (ref 0.0–0.2)

## 2020-02-13 LAB — BASIC METABOLIC PANEL
Anion gap: 10 (ref 5–15)
BUN: 33 mg/dL — ABNORMAL HIGH (ref 8–23)
CO2: 34 mmol/L — ABNORMAL HIGH (ref 22–32)
Calcium: 8.1 mg/dL — ABNORMAL LOW (ref 8.9–10.3)
Chloride: 101 mmol/L (ref 98–111)
Creatinine, Ser: 0.62 mg/dL (ref 0.44–1.00)
GFR, Estimated: >60 mL/min (ref >60)
Glucose, Bld: 160 mg/dL — ABNORMAL HIGH (ref 70–99)
Potassium: 3.6 mmol/L (ref 3.5–5.1)
Sodium: 145 mmol/L (ref 135–145)

## 2020-02-13 LAB — URINE CULTURE: Culture: >=100000 — AB

## 2020-02-13 LAB — PHOSPHORUS: Phosphorus: 2.8 mg/dL (ref 2.5–4.6)

## 2020-02-13 LAB — MAGNESIUM: Magnesium: 2.3 mg/dL (ref 1.7–2.4)

## 2020-02-13 NOTE — Progress Notes (Signed)
Pulmonary Critical Care Medicine [facility] [place]   PULMONARY CRITICAL CARE SERVICE  PROGRESS NOTE  Date of Service: 02/13/2020  Kristine Herrera  SWH:[id number]  DOB: 1940-02-12   DOA: 02/09/2020  Referring Physician: Carron Curie, MD  HPI: Kristine Herrera is a 80 y.o. female seen for follow up of Acute on Chronic Respiratory Failure.  Patient currently is on T collar has been on 40% FiO2 was able to do 16 hours of pressure support yesterday so is going to wean for T collar 2 hours at least  Medications: Reviewed on Rounds  Physical Exam:  Vitals: Temperature is 98.1 pulse 78 respiratory rate 38 blood pressure is 156/66 saturations 97%  Ventilator Settings on T collar FiO2 of 40%  . General: Comfortable at this time . Eyes: Grossly normal lids, irises & conjunctiva . ENT: grossly tongue is normal . Neck: no obvious mass . Cardiovascular: S1 S2 normal no gallop . Respiratory: No rhonchi very coarse breath sounds . Abdomen: soft . Skin: no rash seen on limited exam . Musculoskeletal: not rigid . Psychiatric:unable to assess . Neurologic: no seizure no involuntary movements         Lab Data:   Basic Metabolic Panel: Recent Labs  Lab 02/10/20 0541 02/11/20 0342 02/12/20 0513 02/13/20 0604  NA 144 144 144 145  K 4.4 2.8* 3.3* 3.6  CL 94* 96* 99 101  CO2 35* 35* 34* 34*  GLUCOSE 95 160* 149* 160*  BUN 49* 50* 41* 33*  CREATININE 0.84 0.89 0.68 0.62  CALCIUM 9.0 8.6* 8.2* 8.1*  MG  --  2.5* 2.4 2.3  PHOS  --  1.9* 2.4* 2.8    ABG: Recent Labs  Lab 02/10/20 0330 02/10/20 0550 02/10/20 0834 02/10/20 1407  PHART 7.322* 7.514* 7.557* 7.555*  PCO2ART 74.3* 48.2* 41.3 42.0  PO2ART 246* 76.9* 99.7 105  HCO3 38.1* 38.6* 36.5* 36.8*  O2SAT 99.8 97.7 98.8 98.8    Liver Function Tests: Recent Labs  Lab 02/11/20 0342  AST 27  ALT 26  ALKPHOS 81  BILITOT 1.1  PROT 6.6  ALBUMIN 2.6*   No results for input(s): LIPASE, AMYLASE in the last 168  hours. No results for input(s): AMMONIA in the last 168 hours.  CBC: Recent Labs  Lab 02/10/20 0541 02/11/20 0342 02/12/20 0513 02/13/20 0604  WBC 9.0 8.9 10.0 13.8*  HGB 9.0* 8.3* 8.3* 8.2*  HCT 29.7* 26.6* 28.0* 28.9*  MCV 96.4 93.3 95.9 98.3  PLT 282 286 230 217    Cardiac Enzymes: No results for input(s): CKTOTAL, CKMB, CKMBINDEX, TROPONINI in the last 168 hours.  BNP (last 3 results) Recent Labs    02/11/20 0342  BNP 254.8*    ProBNP (last 3 results) No results for input(s): PROBNP in the last 8760 hours.  Radiological Exams: DG CHEST PORT 1 VIEW  Result Date: 02/12/2020 CLINICAL DATA:  Pneumonia EXAM: PORTABLE CHEST 1 VIEW COMPARISON:  02/10/2020 FINDINGS: Tracheostomy and nasoenteric feeding tube extending into the upper abdomen beyond the margin of the examination are unchanged. Pulmonary insufflation is stable. Small right pleural effusion is again seen. Progressive left lower lobe collapse with resultant retrocardiac opacification noted. There is progressive diffuse pulmonary infiltrate, infection versus edema. IMPRESSION: Progressive left lower lobe collapse. Progressive diffuse pulmonary infiltrate, infection versus edema. Stable small right pleural effusion. Electronically Signed   By: Helyn Numbers MD   On: 02/12/2020 06:24   ECHOCARDIOGRAM COMPLETE  Result Date: 02/11/2020    ECHOCARDIOGRAM REPORT  Patient Name:   Kristine Herrera Plant Date of Exam: 02/11/2020 Medical Rec #:  [id number]        Height: Accession #:    [id number]       Weight: Date of Birth:  Apr 28, 1940        BSA: Patient Age:    79 years         BP:           129/70 mmHg Patient Gender: F                HR:           69 bpm. Exam Location:  Inpatient Procedure: 2D Echo, Cardiac Doppler and Color Doppler Indications:     CHF, atrial fibrillation  History:         Patient has no prior history of Echocardiogram examinations.                  CHF, Arrythmias:Atrial Fibrillation, Signs/Symptoms:Resp.                   failure; Risk Factors:Diabetes, Hypertension and Dyslipidemia.                  Type A aortic dissection repair, Trach collar.  Sonographer:     Lavenia Atlas RDCS Referring Phys:  [id number] Orpah Cobb Diagnosing Phys: Orpah Cobb MD IMPRESSIONS  1. Left ventricular ejection fraction, by estimation, is 60 to 65%. The left ventricle has normal function. The left ventricle has no regional wall motion abnormalities. There is mild concentric left ventricular hypertrophy. Left ventricular diastolic parameters are indeterminate.  2. Right ventricular systolic function is low normal. The right ventricular size is normal. There is mildly elevated pulmonary artery systolic pressure.  3. Left atrial size was moderately dilated.  4. Right atrial size was mildly dilated.  5. The mitral valve is degenerative. Mild mitral valve regurgitation.  6. Tricuspid valve regurgitation is moderate.  7. The aortic valve is tricuspid. There is mild calcification of the aortic valve. There is mild thickening of the aortic valve. Aortic valve regurgitation is mild.  8. The inferior vena cava is dilated in size with <50% respiratory variability, suggesting right atrial pressure of 15 mmHg. FINDINGS  Left Ventricle: Left ventricular ejection fraction, by estimation, is 60 to 65%. The left ventricle has normal function. The left ventricle has no regional wall motion abnormalities. The left ventricular internal cavity size was normal in size. There is  mild concentric left ventricular hypertrophy. Left ventricular diastolic parameters are indeterminate. Right Ventricle: The right ventricular size is normal. No increase in right ventricular wall thickness. Right ventricular systolic function is low normal. There is mildly elevated pulmonary artery systolic pressure. The tricuspid regurgitant velocity is 3.11 m/s, and with an assumed right atrial pressure of 3 mmHg, the estimated right ventricular systolic pressure is 41.7 mmHg. Left Atrium:  Left atrial size was moderately dilated. Right Atrium: Right atrial size was mildly dilated. Pericardium: There is no evidence of pericardial effusion. Mitral Valve: The mitral valve is degenerative in appearance. There is mild thickening of the mitral valve leaflet(s). There is mild calcification of the mitral valve leaflet(s). Mild mitral annular calcification. Mild mitral valve regurgitation. Tricuspid Valve: The tricuspid valve is normal in structure. Tricuspid valve regurgitation is moderate. Aortic Valve: The aortic valve is tricuspid. There is mild calcification of the aortic valve. There is mild thickening of the aortic valve. There is mild aortic valve annular calcification. Aortic valve regurgitation is  mild. Aortic regurgitation PHT measures 309 msec. Pulmonic Valve: The pulmonic valve was normal in structure. Pulmonic valve regurgitation is mild. Aorta: The aortic root is normal in size and structure. There is minimal (Grade I) atheroma plaque involving the aortic root and ascending aorta. Venous: The inferior vena cava is dilated in size with less than 50% respiratory variability, suggesting right atrial pressure of 15 mmHg. IAS/Shunts: The interatrial septum was not assessed.  LEFT VENTRICLE PLAX 2D LVIDd:         4.10 cm  Diastology LVIDs:         2.60 cm  LV e' medial:    5.11 cm/s LV PW:         1.20 cm  LV E/e' medial:  21.9 LV IVS:        1.30 cm  LV e' lateral:   6.85 cm/s LVOT diam:     2.30 cm  LV E/e' lateral: 16.4 LV SV:         138 LVOT Area:     4.15 cm  RIGHT VENTRICLE RV Basal diam:  3.20 cm RV S prime:     9.03 cm/s TAPSE (M-mode): 2.6 cm LEFT ATRIUM             RIGHT ATRIUM LA diam:        4.40 cm RA Area:     16.30 cm LA Vol (A2C):   52.1 ml RA Volume:   41.00 ml LA Vol (A4C):   83.9 ml LA Biplane Vol: 69.6 ml  AORTIC VALVE LVOT Vmax:   135.00 cm/s LVOT Vmean:  91.700 cm/s LVOT VTI:    0.331 m AI PHT:      309 msec  AORTA Ao Root diam: 3.00 cm MITRAL VALVE                TRICUSPID  VALVE MV Area (PHT): 4.68 cm     TR Peak grad:   38.7 mmHg MV Decel Time: 162 msec     TR Vmax:        311.00 cm/s MV E velocity: 112.00 cm/s MV A velocity: 38.30 cm/s   SHUNTS MV E/A ratio:  2.92         Systemic VTI:  0.33 m                             Systemic Diam: 2.30 cm Orpah Cobb MD Electronically signed by Orpah Cobb MD Signature Date/Time: 02/11/2020/4:28:14 PM    Final     Assessment/Plan Active Problems:   Acute on chronic respiratory failure with hypoxia (HCC)   Chronic atrial fibrillation (HCC)   Pneumothorax   Chronic heart failure with preserved ejection fraction (HCC)   Ascending aortic dissection (HCC)   1. Acute on chronic respiratory failure hypoxia we will continue with T collar trials titrate oxygen continue pulmonary toilet. 2. Chronic atrial fibrillation rate is controlled 3. Pneumothorax resolved 4. Chronic heart failure preserved ejection fraction cardiology is following the patient will continue with their recommendations 5. Ascending aortic dissection status post repair   I have personally seen and evaluated the patient, evaluated laboratory and imaging results, formulated the assessment and plan and placed orders. The Patient requires high complexity decision making with multiple systems involvement.  Rounds were done with the Respiratory Therapy Director and Staff therapists and discussed with nursing staff also.  Yevonne Pax, MD [facility] Pulmonary Critical Care Medicine Sleep Medicine

## 2020-02-14 ENCOUNTER — Other Ambulatory Visit (HOSPITAL_COMMUNITY): Payer: Medicare Other

## 2020-02-14 DIAGNOSIS — I7101 Dissection of thoracic aorta: Secondary | ICD-10-CM | POA: Diagnosis not present

## 2020-02-14 DIAGNOSIS — J9621 Acute and chronic respiratory failure with hypoxia: Secondary | ICD-10-CM | POA: Diagnosis not present

## 2020-02-14 DIAGNOSIS — I5032 Chronic diastolic (congestive) heart failure: Secondary | ICD-10-CM | POA: Diagnosis not present

## 2020-02-14 DIAGNOSIS — I482 Chronic atrial fibrillation, unspecified: Secondary | ICD-10-CM | POA: Diagnosis not present

## 2020-02-14 LAB — CBC
HCT: 29.2 % — ABNORMAL LOW (ref 36.0–46.0)
Hemoglobin: 8.7 g/dL — ABNORMAL LOW (ref 12.0–15.0)
MCH: 29.7 pg (ref 26.0–34.0)
MCHC: 29.8 g/dL — ABNORMAL LOW (ref 30.0–36.0)
MCV: 99.7 fL (ref 80.0–100.0)
Platelets: 206 10*3/uL (ref 150–400)
RBC: 2.93 MIL/uL — ABNORMAL LOW (ref 3.87–5.11)
RDW: 18.4 % — ABNORMAL HIGH (ref 11.5–15.5)
WBC: 13.7 10*3/uL — ABNORMAL HIGH (ref 4.0–10.5)
nRBC: 0 % (ref 0.0–0.2)

## 2020-02-14 LAB — BASIC METABOLIC PANEL
Anion gap: 10 (ref 5–15)
BUN: 34 mg/dL — ABNORMAL HIGH (ref 8–23)
CO2: 34 mmol/L — ABNORMAL HIGH (ref 22–32)
Calcium: 8.3 mg/dL — ABNORMAL LOW (ref 8.9–10.3)
Chloride: 100 mmol/L (ref 98–111)
Creatinine, Ser: 0.69 mg/dL (ref 0.44–1.00)
GFR, Estimated: >60 mL/min (ref >60)
Glucose, Bld: 117 mg/dL — ABNORMAL HIGH (ref 70–99)
Potassium: 3.9 mmol/L (ref 3.5–5.1)
Sodium: 144 mmol/L (ref 135–145)

## 2020-02-14 LAB — MAGNESIUM: Magnesium: 2.4 mg/dL (ref 1.7–2.4)

## 2020-02-14 LAB — PHOSPHORUS: Phosphorus: 2.8 mg/dL (ref 2.5–4.6)

## 2020-02-14 NOTE — Progress Notes (Signed)
Pulmonary Critical Care Medicine [facility] [place]   PULMONARY CRITICAL CARE SERVICE  PROGRESS NOTE  Date of Service: 02/14/2020  Kristine Herrera  YSA:[id number]  DOB: [birth year]   DOA: 02/09/2020  Referring Physician: Carron Curie, MD  HPI: Kristine Herrera is a 80 y.o. female seen for follow up of Acute on Chronic Respiratory Failure.  Patient at this time is on full support on assist control mode has been on 40% FiO2 was attempted at T-bar did not do well  Medications: Reviewed on Rounds  Physical Exam:  Vitals: Temperature is 97.0 pulse 65 respiratory rate 28 blood pressure is 133/46 saturations 96%  Ventilator Settings on assist control FiO2 40% tidal volume 450 PEEP 5  . General: Comfortable at this time . Eyes: Grossly normal lids, irises & conjunctiva . ENT: grossly tongue is normal . Neck: no obvious mass . Cardiovascular: S1 S2 normal no gallop . Respiratory: No rhonchi very coarse breath sound . Abdomen: soft . Skin: no rash seen on limited exam . Musculoskeletal: not rigid . Psychiatric:unable to assess . Neurologic: no seizure no involuntary movements         Lab Data:   Basic Metabolic Panel: Recent Labs  Lab 02/10/20 0541 02/11/20 0342 02/12/20 0513 02/13/20 0604 02/14/20 0315  NA 144 144 144 145 144  K 4.4 2.8* 3.3* 3.6 3.9  CL 94* 96* 99 101 100  CO2 35* 35* 34* 34* 34*  GLUCOSE 95 160* 149* 160* 117*  BUN 49* 50* 41* 33* 34*  CREATININE 0.84 0.89 0.68 0.62 0.69  CALCIUM 9.0 8.6* 8.2* 8.1* 8.3*  MG  --  2.5* 2.4 2.3 2.4  PHOS  --  1.9* 2.4* 2.8 2.8    ABG: Recent Labs  Lab 02/10/20 0330 02/10/20 0550 02/10/20 0834 02/10/20 1407  PHART 7.322* 7.514* 7.557* 7.555*  PCO2ART 74.3* 48.2* 41.3 42.0  PO2ART 246* 76.9* 99.7 105  HCO3 38.1* 38.6* 36.5* 36.8*  O2SAT 99.8 97.7 98.8 98.8    Liver Function Tests: Recent Labs  Lab 02/11/20 0342  AST 27  ALT 26  ALKPHOS 81  BILITOT 1.1  PROT 6.6  ALBUMIN 2.6*   No results  for input(s): LIPASE, AMYLASE in the last 168 hours. No results for input(s): AMMONIA in the last 168 hours.  CBC: Recent Labs  Lab 02/10/20 0541 02/11/20 0342 02/12/20 0513 02/13/20 0604 02/14/20 0315  WBC 9.0 8.9 10.0 13.8* 13.7*  HGB 9.0* 8.3* 8.3* 8.2* 8.7*  HCT 29.7* 26.6* 28.0* 28.9* 29.2*  MCV 96.4 93.3 95.9 98.3 99.7  PLT 282 286 230 217 206    Cardiac Enzymes: No results for input(s): CKTOTAL, CKMB, CKMBINDEX, TROPONINI in the last 168 hours.  BNP (last 3 results) Recent Labs    02/11/20 0342  BNP 254.8*    ProBNP (last 3 results) No results for input(s): PROBNP in the last 8760 hours.  Radiological Exams: No results found.  Assessment/Plan Active Problems:   Acute on chronic respiratory failure with hypoxia (HCC)   Chronic atrial fibrillation (HCC)   Pneumothorax   Chronic heart failure with preserved ejection fraction (HCC)   Ascending aortic dissection (HCC)   1. Acute on chronic respiratory failure hypoxia we will continue with full support on assist control titrate oxygen continue pulmonary toilet. 2. Chronic atrial fibrillation rate is controlled 3. Pneumothorax resolved 4. Chronic heart failure preserved ejection fraction 5. Ascending aortic aneurysm has been untreated   I have personally seen and evaluated the patient, evaluated laboratory and  imaging results, formulated the assessment and plan and placed orders. The Patient requires high complexity decision making with multiple systems involvement.  Rounds were done with the Respiratory Therapy Director and Staff therapists and discussed with nursing staff also.  Allyne Gee, MD [facility] Pulmonary Critical Care Medicine Sleep Medicine

## 2020-02-15 DIAGNOSIS — I7101 Dissection of thoracic aorta: Secondary | ICD-10-CM | POA: Diagnosis not present

## 2020-02-15 DIAGNOSIS — I482 Chronic atrial fibrillation, unspecified: Secondary | ICD-10-CM | POA: Diagnosis not present

## 2020-02-15 DIAGNOSIS — I5032 Chronic diastolic (congestive) heart failure: Secondary | ICD-10-CM | POA: Diagnosis not present

## 2020-02-15 DIAGNOSIS — J9621 Acute and chronic respiratory failure with hypoxia: Secondary | ICD-10-CM | POA: Diagnosis not present

## 2020-02-15 LAB — BASIC METABOLIC PANEL
Anion gap: 14 (ref 5–15)
BUN: 38 mg/dL — ABNORMAL HIGH (ref 8–23)
CO2: 30 mmol/L (ref 22–32)
Calcium: 8.5 mg/dL — ABNORMAL LOW (ref 8.9–10.3)
Chloride: 99 mmol/L (ref 98–111)
Creatinine, Ser: 0.7 mg/dL (ref 0.44–1.00)
GFR, Estimated: >60 mL/min (ref >60)
Glucose, Bld: 142 mg/dL — ABNORMAL HIGH (ref 70–99)
Potassium: 4 mmol/L (ref 3.5–5.1)
Sodium: 143 mmol/L (ref 135–145)

## 2020-02-15 LAB — CBC
HCT: 30.4 % — ABNORMAL LOW (ref 36.0–46.0)
Hemoglobin: 9.1 g/dL — ABNORMAL LOW (ref 12.0–15.0)
MCH: 29.2 pg (ref 26.0–34.0)
MCHC: 29.9 g/dL — ABNORMAL LOW (ref 30.0–36.0)
MCV: 97.4 fL (ref 80.0–100.0)
Platelets: 192 10*3/uL (ref 150–400)
RBC: 3.12 MIL/uL — ABNORMAL LOW (ref 3.87–5.11)
RDW: 18.3 % — ABNORMAL HIGH (ref 11.5–15.5)
WBC: 15.8 10*3/uL — ABNORMAL HIGH (ref 4.0–10.5)
nRBC: 0 % (ref 0.0–0.2)

## 2020-02-15 LAB — MAGNESIUM: Magnesium: 2.4 mg/dL (ref 1.7–2.4)

## 2020-02-15 LAB — PHOSPHORUS: Phosphorus: 3 mg/dL (ref 2.5–4.6)

## 2020-02-15 NOTE — Progress Notes (Signed)
Pulmonary Critical Care Medicine [facility] [place]   PULMONARY CRITICAL CARE SERVICE  PROGRESS NOTE  Date of Service: 02/15/2020  Kristine Herrera  YTK:[id number]  DOB: 23-Jan-1941   DOA: 02/09/2020  Referring Physician: Carron Curie, MD  HPI: Kristine Herrera is a 80 y.o. female seen for follow up of Acute on Chronic Respiratory Failure.  Patient is on assist control currently on 40% FiO2 with a PEEP of 5 was attempted at pressure support has not been tolerating  Medications: Reviewed on Rounds  Physical Exam:  Vitals: Temperature is 97.4 pulse 69 respiratory rate 23 blood pressure is 126/54 saturations 100%  Ventilator Settings on assist control FiO2 40% tidal volume 450 PEEP 5  . General: Comfortable at this time . Eyes: Grossly normal lids, irises & conjunctiva . ENT: grossly tongue is normal . Neck: no obvious mass . Cardiovascular: S1 S2 normal no gallop . Respiratory: No rhonchi very coarse percent . Abdomen: soft . Skin: no rash seen on limited exam . Musculoskeletal: not rigid . Psychiatric:unable to assess . Neurologic: no seizure no involuntary movements         Lab Data:   Basic Metabolic Panel: Recent Labs  Lab 02/11/20 0342 02/12/20 0513 02/13/20 0604 02/14/20 0315 02/15/20 0515  NA 144 144 145 144 143  K 2.8* 3.3* 3.6 3.9 4.0  CL 96* 99 101 100 99  CO2 35* 34* 34* 34* 30  GLUCOSE 160* 149* 160* 117* 142*  BUN 50* 41* 33* 34* 38*  CREATININE 0.89 0.68 0.62 0.69 0.70  CALCIUM 8.6* 8.2* 8.1* 8.3* 8.5*  MG 2.5* 2.4 2.3 2.4 2.4  PHOS 1.9* 2.4* 2.8 2.8 3.0    ABG: Recent Labs  Lab 02/10/20 0330 02/10/20 0550 02/10/20 0834 02/10/20 1407  PHART 7.322* 7.514* 7.557* 7.555*  PCO2ART 74.3* 48.2* 41.3 42.0  PO2ART 246* 76.9* 99.7 105  HCO3 38.1* 38.6* 36.5* 36.8*  O2SAT 99.8 97.7 98.8 98.8    Liver Function Tests: Recent Labs  Lab 02/11/20 0342  AST 27  ALT 26  ALKPHOS 81  BILITOT 1.1  PROT 6.6  ALBUMIN 2.6*   No results  for input(s): LIPASE, AMYLASE in the last 168 hours. No results for input(s): AMMONIA in the last 168 hours.  CBC: Recent Labs  Lab 02/11/20 0342 02/12/20 0513 02/13/20 0604 02/14/20 0315 02/15/20 0515  WBC 8.9 10.0 13.8* 13.7* 15.8*  HGB 8.3* 8.3* 8.2* 8.7* 9.1*  HCT 26.6* 28.0* 28.9* 29.2* 30.4*  MCV 93.3 95.9 98.3 99.7 97.4  PLT 286 230 217 206 192    Cardiac Enzymes: No results for input(s): CKTOTAL, CKMB, CKMBINDEX, TROPONINI in the last 168 hours.  BNP (last 3 results) Recent Labs    02/11/20 0342  BNP 254.8*    ProBNP (last 3 results) No results for input(s): PROBNP in the last 8760 hours.  Radiological Exams: DG CHEST PORT 1 VIEW  Result Date: 02/14/2020 CLINICAL DATA:  Respiratory failure.  Pneumonia.  On ventilator. EXAM: PORTABLE CHEST 1 VIEW COMPARISON:  Chest x-rays dated 02/12/2020 and 02/10/2020. FINDINGS: Now in near complete opacification the RIGHT hemithorax. Diffuse interstitial opacities throughout the LEFT lung are not significantly changed, compatible with pulmonary edema versus atypical pneumonia. Enteric tube passes below the diaphragm. Tracheostomy tube appears appropriately positioned in the midline. No pneumothorax is seen. IMPRESSION: 1. Worsening opacification of the RIGHT hemithorax, now near complete opacification. This could represent worsening pneumonia, airspace collapse and/or pleural effusion. 2. Stable diffuse interstitial opacities throughout the LEFT lung, compatible  with pulmonary edema versus atypical pneumonia. Electronically Signed   By: Bary Richard M.D.   On: 02/14/2020 13:40    Assessment/Plan Active Problems:   Acute on chronic respiratory failure with hypoxia (HCC)   Chronic atrial fibrillation (HCC)   Pneumothorax   Chronic heart failure with preserved ejection fraction (HCC)   Ascending aortic dissection (HCC)   1. Acute on chronic respiratory failure hypoxia plan is to continue with assessing the spontaneous breathing  trials. 2. Chronic atrial fibrillation rate is controlled 3. Pneumothorax at baseline 4. Chronic heart failure with preserved ejection fraction 5. Ascending aortic dissection no change we will continue to follow along   I have personally seen and evaluated the patient, evaluated laboratory and imaging results, formulated the assessment and plan and placed orders. The Patient requires high complexity decision making with multiple systems involvement.  Rounds were done with the Respiratory Therapy Director and Staff therapists and discussed with nursing staff also.  Yevonne Pax, MD [facility] Pulmonary Critical Care Medicine Sleep Medicine

## 2020-02-16 DIAGNOSIS — J9621 Acute and chronic respiratory failure with hypoxia: Secondary | ICD-10-CM | POA: Diagnosis not present

## 2020-02-16 DIAGNOSIS — I482 Chronic atrial fibrillation, unspecified: Secondary | ICD-10-CM | POA: Diagnosis not present

## 2020-02-16 DIAGNOSIS — I5032 Chronic diastolic (congestive) heart failure: Secondary | ICD-10-CM | POA: Diagnosis not present

## 2020-02-16 DIAGNOSIS — I7101 Dissection of thoracic aorta: Secondary | ICD-10-CM | POA: Diagnosis not present

## 2020-02-16 NOTE — Progress Notes (Signed)
Pulmonary Critical Care Medicine [facility] [place]   PULMONARY CRITICAL CARE SERVICE  PROGRESS NOTE  Date of Service: 02/16/2020  Kristine Herrera  ZOX:[id number]  DOB: October 08, 1940   DOA: 02/09/2020  Referring Physician: Carron Curie, MD  HPI: Kristine Herrera is a 80 y.o. female seen for follow up of Acute on Chronic Respiratory Failure.  Patient at this time is having very copious secretions has not been tolerating pressure support weaning right now is on full support on the ventilator  Medications: Reviewed on Rounds  Physical Exam:  Vitals: Temperature 98.6 pulse 80 respiratory rate 20 blood pressure is 140/61 saturations 100%  Ventilator Settings on assist control FiO2 40% tidal volume 450 PEEP 5  . General: Comfortable at this time . Eyes: Grossly normal lids, irises & conjunctiva . ENT: grossly tongue is normal . Neck: no obvious mass . Cardiovascular: S1 S2 normal no gallop . Respiratory: Scattered rhonchi expansion is equal . Abdomen: soft . Skin: no rash seen on limited exam . Musculoskeletal: not rigid . Psychiatric:unable to assess . Neurologic: no seizure no involuntary movements         Lab Data:   Basic Metabolic Panel: Recent Labs  Lab 02/11/20 0342 02/12/20 0513 02/13/20 0604 02/14/20 0315 02/15/20 0515  NA 144 144 145 144 143  K 2.8* 3.3* 3.6 3.9 4.0  CL 96* 99 101 100 99  CO2 35* 34* 34* 34* 30  GLUCOSE 160* 149* 160* 117* 142*  BUN 50* 41* 33* 34* 38*  CREATININE 0.89 0.68 0.62 0.69 0.70  CALCIUM 8.6* 8.2* 8.1* 8.3* 8.5*  MG 2.5* 2.4 2.3 2.4 2.4  PHOS 1.9* 2.4* 2.8 2.8 3.0    ABG: Recent Labs  Lab 02/10/20 0330 02/10/20 0550 02/10/20 0834 02/10/20 1407  PHART 7.322* 7.514* 7.557* 7.555*  PCO2ART 74.3* 48.2* 41.3 42.0  PO2ART 246* 76.9* 99.7 105  HCO3 38.1* 38.6* 36.5* 36.8*  O2SAT 99.8 97.7 98.8 98.8    Liver Function Tests: Recent Labs  Lab 02/11/20 0342  AST 27  ALT 26  ALKPHOS 81  BILITOT 1.1  PROT 6.6   ALBUMIN 2.6*   No results for input(s): LIPASE, AMYLASE in the last 168 hours. No results for input(s): AMMONIA in the last 168 hours.  CBC: Recent Labs  Lab 02/11/20 0342 02/12/20 0513 02/13/20 0604 02/14/20 0315 02/15/20 0515  WBC 8.9 10.0 13.8* 13.7* 15.8*  HGB 8.3* 8.3* 8.2* 8.7* 9.1*  HCT 26.6* 28.0* 28.9* 29.2* 30.4*  MCV 93.3 95.9 98.3 99.7 97.4  PLT 286 230 217 206 192    Cardiac Enzymes: No results for input(s): CKTOTAL, CKMB, CKMBINDEX, TROPONINI in the last 168 hours.  BNP (last 3 results) Recent Labs    02/11/20 0342  BNP 254.8*    ProBNP (last 3 results) No results for input(s): PROBNP in the last 8760 hours.  Radiological Exams: No results found.  Assessment/Plan Active Problems:   Acute on chronic respiratory failure with hypoxia (HCC)   Chronic atrial fibrillation (HCC)   Pneumothorax   Chronic heart failure with preserved ejection fraction (HCC)   Ascending aortic dissection (HCC)   1. Acute on chronic respiratory failure with hypoxia we will continue with the full vent support right now because patient has been failing attempts at pressure support 2. Chronic atrial fibrillation rate is controlled 3. Thorax treated resolved 4. Chronic heart failure preserved ejection fraction we will continue to monitor 5. Ascending aortic dissection status postrepair   I have personally seen and evaluated  the patient, evaluated laboratory and imaging results, formulated the assessment and plan and placed orders. The Patient requires high complexity decision making with multiple systems involvement.  Rounds were done with the Respiratory Therapy Director and Staff therapists and discussed with nursing staff also.  Yevonne Pax, MD [facility] Pulmonary Critical Care Medicine Sleep Medicine

## 2020-02-17 ENCOUNTER — Other Ambulatory Visit (HOSPITAL_COMMUNITY): Payer: Medicare Other

## 2020-02-17 DIAGNOSIS — I7101 Dissection of thoracic aorta: Secondary | ICD-10-CM | POA: Diagnosis not present

## 2020-02-17 DIAGNOSIS — I482 Chronic atrial fibrillation, unspecified: Secondary | ICD-10-CM | POA: Diagnosis not present

## 2020-02-17 DIAGNOSIS — I5032 Chronic diastolic (congestive) heart failure: Secondary | ICD-10-CM | POA: Diagnosis not present

## 2020-02-17 DIAGNOSIS — J9621 Acute and chronic respiratory failure with hypoxia: Secondary | ICD-10-CM | POA: Diagnosis not present

## 2020-02-17 LAB — CBC
HCT: 25.4 % — ABNORMAL LOW (ref 36.0–46.0)
Hemoglobin: 7.7 g/dL — ABNORMAL LOW (ref 12.0–15.0)
MCH: 28.9 pg (ref 26.0–34.0)
MCHC: 30.3 g/dL (ref 30.0–36.0)
MCV: 95.5 fL (ref 80.0–100.0)
Platelets: 217 10*3/uL (ref 150–400)
RBC: 2.66 MIL/uL — ABNORMAL LOW (ref 3.87–5.11)
RDW: 17.8 % — ABNORMAL HIGH (ref 11.5–15.5)
WBC: 10.5 10*3/uL (ref 4.0–10.5)
nRBC: 0 % (ref 0.0–0.2)

## 2020-02-17 LAB — BASIC METABOLIC PANEL
Anion gap: 10 (ref 5–15)
BUN: 39 mg/dL — ABNORMAL HIGH (ref 8–23)
CO2: 35 mmol/L — ABNORMAL HIGH (ref 22–32)
Calcium: 8 mg/dL — ABNORMAL LOW (ref 8.9–10.3)
Chloride: 97 mmol/L — ABNORMAL LOW (ref 98–111)
Creatinine, Ser: 0.68 mg/dL (ref 0.44–1.00)
GFR, Estimated: >60 mL/min (ref >60)
Glucose, Bld: 141 mg/dL — ABNORMAL HIGH (ref 70–99)
Potassium: 3.2 mmol/L — ABNORMAL LOW (ref 3.5–5.1)
Sodium: 142 mmol/L (ref 135–145)

## 2020-02-17 NOTE — Progress Notes (Signed)
Pulmonary Critical Care Medicine [facility] [place]   PULMONARY CRITICAL CARE SERVICE  PROGRESS NOTE  Date of Service: 02/17/2020  SARGUN RUMMELL  JOA:[id number]  DOB: August 01, 1940   DOA: 02/09/2020  Referring Physician: Carron Curie, MD  HPI: Rina Adney Vanalstyne is a 80 y.o. female seen for follow up of Acute on Chronic Respiratory Failure.  Patient currently is on assist control mode has been on 40% FiO2 good saturations are noted.  Patient was attempted on weaning but had not been tolerating so respiratory therapy hold off on doing the wean this morning  Medications: Reviewed on Rounds  Physical Exam:  Vitals: Temperature is 98.6 pulse 70 respiratory rate is 26 blood pressure is 133/66 saturations 100%  Ventilator Settings on assist control FiO2 is 40% tidal volume 450 PEEP 5  . General: Comfortable at this time . Eyes: Grossly normal lids, irises & conjunctiva . ENT: grossly tongue is normal . Neck: no obvious mass . Cardiovascular: S1 S2 normal no gallop . Respiratory: Scattered rhonchi expansion is equal . Abdomen: soft . Skin: no rash seen on limited exam . Musculoskeletal: not rigid . Psychiatric:unable to assess . Neurologic: no seizure no involuntary movements         Lab Data:   Basic Metabolic Panel: Recent Labs  Lab 02/11/20 0342 02/12/20 0513 02/13/20 0604 02/14/20 0315 02/15/20 0515 02/17/20 0415  NA 144 144 145 144 143 142  K 2.8* 3.3* 3.6 3.9 4.0 3.2*  CL 96* 99 101 100 99 97*  CO2 35* 34* 34* 34* 30 35*  GLUCOSE 160* 149* 160* 117* 142* 141*  BUN 50* 41* 33* 34* 38* 39*  CREATININE 0.89 0.68 0.62 0.69 0.70 0.68  CALCIUM 8.6* 8.2* 8.1* 8.3* 8.5* 8.0*  MG 2.5* 2.4 2.3 2.4 2.4  --   PHOS 1.9* 2.4* 2.8 2.8 3.0  --     ABG: Recent Labs  Lab 02/10/20 1407  PHART 7.555*  PCO2ART 42.0  PO2ART 105  HCO3 36.8*  O2SAT 98.8    Liver Function Tests: Recent Labs  Lab 02/11/20 0342  AST 27  ALT 26  ALKPHOS 81  BILITOT 1.1  PROT  6.6  ALBUMIN 2.6*   No results for input(s): LIPASE, AMYLASE in the last 168 hours. No results for input(s): AMMONIA in the last 168 hours.  CBC: Recent Labs  Lab 02/12/20 0513 02/13/20 0604 02/14/20 0315 02/15/20 0515 02/17/20 0415  WBC 10.0 13.8* 13.7* 15.8* 10.5  HGB 8.3* 8.2* 8.7* 9.1* 7.7*  HCT 28.0* 28.9* 29.2* 30.4* 25.4*  MCV 95.9 98.3 99.7 97.4 95.5  PLT 230 217 206 192 217    Cardiac Enzymes: No results for input(s): CKTOTAL, CKMB, CKMBINDEX, TROPONINI in the last 168 hours.  BNP (last 3 results) Recent Labs    02/11/20 0342  BNP 254.8*    ProBNP (last 3 results) No results for input(s): PROBNP in the last 8760 hours.  Radiological Exams: DG CHEST PORT 1 VIEW  Result Date: 02/17/2020 CLINICAL DATA:  Pneumonia EXAM: PORTABLE CHEST 1 VIEW COMPARISON:  February 14, 2020 FINDINGS: Tracheostomy catheter tip is 5.2 cm above the carina. Feeding tube tip is below the diaphragm. No pneumothorax. There is a partially loculated right pleural effusion, grossly stable. There is slightly less interstitial thickening overall, likely due to partial but incomplete resolution of interstitial pulmonary edema. Heart is upper normal in size with pulmonary vascularity normal. No adenopathy. No bone lesions. IMPRESSION: Tube positions as described without pneumothorax. Sizable partially loculated right pleural  effusion. There may well be superimposed atelectasis and/or consolidation on the right. There is a degree of underlying interstitial pulmonary edema, slightly less compared to recent study. No new opacity evident. Stable cardiac silhouette. Electronically Signed   By: Bretta Bang III M.D.   On: 02/17/2020 08:17    Assessment/Plan Active Problems:   Acute on chronic respiratory failure with hypoxia (HCC)   Chronic atrial fibrillation (HCC)   Pneumothorax   Chronic heart failure with preserved ejection fraction (HCC)   Ascending aortic dissection (HCC)   1. Acute on  chronic respiratory failure with hypoxia we will continue with assist control patient currently on 40% FiO2 with a PEEP of 5. 2. Chronic atrial fibrillation rate is controlled 3. Pneumothorax treated resolved 4. Chronic heart failure with preserved ejection fraction we will continue to monitor along. 5. Ascending aortic dissection no change   I have personally seen and evaluated the patient, evaluated laboratory and imaging results, formulated the assessment and plan and placed orders. The Patient requires high complexity decision making with multiple systems involvement.  Rounds were done with the Respiratory Therapy Director and Staff therapists and discussed with nursing staff also.  Yevonne Pax, MD [facility] Pulmonary Critical Care Medicine Sleep Medicine

## 2020-02-18 DIAGNOSIS — J9621 Acute and chronic respiratory failure with hypoxia: Secondary | ICD-10-CM | POA: Diagnosis not present

## 2020-02-18 DIAGNOSIS — I7101 Dissection of thoracic aorta: Secondary | ICD-10-CM | POA: Diagnosis not present

## 2020-02-18 DIAGNOSIS — I482 Chronic atrial fibrillation, unspecified: Secondary | ICD-10-CM | POA: Diagnosis not present

## 2020-02-18 DIAGNOSIS — I5032 Chronic diastolic (congestive) heart failure: Secondary | ICD-10-CM | POA: Diagnosis not present

## 2020-02-18 LAB — OCCULT BLOOD X 1 CARD TO LAB, STOOL: Fecal Occult Bld: POSITIVE — AB

## 2020-02-18 LAB — CBC
HCT: 25.5 % — ABNORMAL LOW (ref 36.0–46.0)
Hemoglobin: 7.4 g/dL — ABNORMAL LOW (ref 12.0–15.0)
MCH: 27.8 pg (ref 26.0–34.0)
MCHC: 29 g/dL — ABNORMAL LOW (ref 30.0–36.0)
MCV: 95.9 fL (ref 80.0–100.0)
Platelets: 211 10*3/uL (ref 150–400)
RBC: 2.66 MIL/uL — ABNORMAL LOW (ref 3.87–5.11)
RDW: 17.3 % — ABNORMAL HIGH (ref 11.5–15.5)
WBC: 9.3 10*3/uL (ref 4.0–10.5)
nRBC: 0 % (ref 0.0–0.2)

## 2020-02-18 LAB — POTASSIUM: Potassium: 3.8 mmol/L (ref 3.5–5.1)

## 2020-02-18 NOTE — Progress Notes (Signed)
Pulmonary Critical Care Medicine [facility] [place]   PULMONARY CRITICAL CARE SERVICE  PROGRESS NOTE  Date of Service: 02/18/2020  Kristine Herrera  WUX:[id number]  DOB: August 29, 1940   DOA: 02/09/2020  Referring Physician: Carron Curie, MD  HPI: Kristine Herrera is a 80 y.o. female seen for follow up of Acute on Chronic Respiratory Failure.  Patient is on pressure support currently on 12/5  Medications: Reviewed on Rounds  Physical Exam:  Vitals: Temperature 98.8 pulse 74 respiratory rate 32 blood pressure is 129/69 saturations 100%  Ventilator Settings currently on pressure support FiO2 40% pressure of 12/5  . General: Comfortable at this time . Eyes: Grossly normal lids, irises & conjunctiva . ENT: grossly tongue is normal . Neck: no obvious mass . Cardiovascular: S1 S2 normal no gallop . Respiratory: No rhonchi very coarse breath sound . Abdomen: soft . Skin: no rash seen on limited exam . Musculoskeletal: not rigid . Psychiatric:unable to assess . Neurologic: no seizure no involuntary movements         Lab Data:   Basic Metabolic Panel: Recent Labs  Lab 02/12/20 0513 02/13/20 0604 02/14/20 0315 02/15/20 0515 02/17/20 0415 02/18/20 0407  NA 144 145 144 143 142  --   K 3.3* 3.6 3.9 4.0 3.2* 3.8  CL 99 101 100 99 97*  --   CO2 34* 34* 34* 30 35*  --   GLUCOSE 149* 160* 117* 142* 141*  --   BUN 41* 33* 34* 38* 39*  --   CREATININE 0.68 0.62 0.69 0.70 0.68  --   CALCIUM 8.2* 8.1* 8.3* 8.5* 8.0*  --   MG 2.4 2.3 2.4 2.4  --   --   PHOS 2.4* 2.8 2.8 3.0  --   --     ABG: No results for input(s): PHART, PCO2ART, PO2ART, HCO3, O2SAT in the last 168 hours.  Liver Function Tests: No results for input(s): AST, ALT, ALKPHOS, BILITOT, PROT, ALBUMIN in the last 168 hours. No results for input(s): LIPASE, AMYLASE in the last 168 hours. No results for input(s): AMMONIA in the last 168 hours.  CBC: Recent Labs  Lab 02/13/20 0604 02/14/20 0315  02/15/20 0515 02/17/20 0415 02/18/20 0407  WBC 13.8* 13.7* 15.8* 10.5 9.3  HGB 8.2* 8.7* 9.1* 7.7* 7.4*  HCT 28.9* 29.2* 30.4* 25.4* 25.5*  MCV 98.3 99.7 97.4 95.5 95.9  PLT 217 206 192 217 211    Cardiac Enzymes: No results for input(s): CKTOTAL, CKMB, CKMBINDEX, TROPONINI in the last 168 hours.  BNP (last 3 results) Recent Labs    02/11/20 0342  BNP 254.8*    ProBNP (last 3 results) No results for input(s): PROBNP in the last 8760 hours.  Radiological Exams: DG CHEST PORT 1 VIEW  Result Date: 02/17/2020 CLINICAL DATA:  Pneumonia EXAM: PORTABLE CHEST 1 VIEW COMPARISON:  February 14, 2020 FINDINGS: Tracheostomy catheter tip is 5.2 cm above the carina. Feeding tube tip is below the diaphragm. No pneumothorax. There is a partially loculated right pleural effusion, grossly stable. There is slightly less interstitial thickening overall, likely due to partial but incomplete resolution of interstitial pulmonary edema. Heart is upper normal in size with pulmonary vascularity normal. No adenopathy. No bone lesions. IMPRESSION: Tube positions as described without pneumothorax. Sizable partially loculated right pleural effusion. There may well be superimposed atelectasis and/or consolidation on the right. There is a degree of underlying interstitial pulmonary edema, slightly less compared to recent study. No new opacity evident. Stable cardiac silhouette. Electronically  Signed   By: Bretta Bang III M.D.   On: 02/17/2020 08:17    Assessment/Plan Active Problems:   Acute on chronic respiratory failure with hypoxia (HCC)   Chronic atrial fibrillation (HCC)   Pneumothorax   Chronic heart failure with preserved ejection fraction (HCC)   Ascending aortic dissection (HCC)   1. Acute on chronic respiratory failure hypoxia we will continue with pressure support titrate oxygen continue pulmonary toilet. 2. Chronic atrial fibrillation rate is controlled at this time 3. Pneumothorax  resolved 4. Chronic heart failure preserved ejection fraction at baseline 5. Aortic dissection no change we will continue to follow   I have personally seen and evaluated the patient, evaluated laboratory and imaging results, formulated the assessment and plan and placed orders. The Patient requires high complexity decision making with multiple systems involvement.  Rounds were done with the Respiratory Therapy Director and Staff therapists and discussed with nursing staff also.  Yevonne Pax, MD [facility] Pulmonary Critical Care Medicine Sleep Medicine

## 2020-02-19 ENCOUNTER — Other Ambulatory Visit (HOSPITAL_COMMUNITY): Payer: Medicare Other

## 2020-02-19 DIAGNOSIS — J9621 Acute and chronic respiratory failure with hypoxia: Secondary | ICD-10-CM | POA: Diagnosis not present

## 2020-02-19 DIAGNOSIS — I482 Chronic atrial fibrillation, unspecified: Secondary | ICD-10-CM | POA: Diagnosis not present

## 2020-02-19 DIAGNOSIS — I7101 Dissection of thoracic aorta: Secondary | ICD-10-CM | POA: Diagnosis not present

## 2020-02-19 DIAGNOSIS — I5032 Chronic diastolic (congestive) heart failure: Secondary | ICD-10-CM | POA: Diagnosis not present

## 2020-02-19 HISTORY — PX: IR THORACENTESIS ASP PLEURAL SPACE W/IMG GUIDE: IMG5380

## 2020-02-19 LAB — LACTATE DEHYDROGENASE, PLEURAL OR PERITONEAL FLUID: LD, Fluid: 358 U/L — ABNORMAL HIGH (ref 3–23)

## 2020-02-19 LAB — CBC
HCT: 24.9 % — ABNORMAL LOW (ref 36.0–46.0)
Hemoglobin: 7.1 g/dL — ABNORMAL LOW (ref 12.0–15.0)
MCH: 27.2 pg (ref 26.0–34.0)
MCHC: 28.5 g/dL — ABNORMAL LOW (ref 30.0–36.0)
MCV: 95.4 fL (ref 80.0–100.0)
Platelets: 224 10*3/uL (ref 150–400)
RBC: 2.61 MIL/uL — ABNORMAL LOW (ref 3.87–5.11)
RDW: 17.1 % — ABNORMAL HIGH (ref 11.5–15.5)
WBC: 8.8 10*3/uL (ref 4.0–10.5)
nRBC: 0 % (ref 0.0–0.2)

## 2020-02-19 LAB — PROTEIN, PLEURAL OR PERITONEAL FLUID: Total protein, fluid: 4.4 g/dL

## 2020-02-19 LAB — BASIC METABOLIC PANEL
Anion gap: 10 (ref 5–15)
BUN: 39 mg/dL — ABNORMAL HIGH (ref 8–23)
CO2: 31 mmol/L (ref 22–32)
Calcium: 7.9 mg/dL — ABNORMAL LOW (ref 8.9–10.3)
Chloride: 99 mmol/L (ref 98–111)
Creatinine, Ser: 0.66 mg/dL (ref 0.44–1.00)
GFR, Estimated: >60 mL/min (ref >60)
Glucose, Bld: 142 mg/dL — ABNORMAL HIGH (ref 70–99)
Potassium: 3.7 mmol/L (ref 3.5–5.1)
Sodium: 140 mmol/L (ref 135–145)

## 2020-02-19 LAB — ABO/RH: ABO/RH(D): A POS

## 2020-02-19 LAB — SARS CORONAVIRUS 2 (TAT 6-24 HRS): SARS Coronavirus 2: NEGATIVE

## 2020-02-19 LAB — CULTURE, RESPIRATORY W GRAM STAIN: Culture: NORMAL

## 2020-02-19 LAB — PREPARE RBC (CROSSMATCH)

## 2020-02-19 LAB — GLUCOSE, PLEURAL OR PERITONEAL FLUID: Glucose, Fluid: 117 mg/dL

## 2020-02-19 LAB — MAGNESIUM: Magnesium: 2.3 mg/dL (ref 1.7–2.4)

## 2020-02-19 MED ORDER — LIDOCAINE HCL 1 % IJ SOLN
INTRAMUSCULAR | Status: AC | PRN
  Administered 2020-02-19: 10 mL

## 2020-02-19 MED ORDER — IOHEXOL 300 MG/ML  SOLN
75.0000 mL | Freq: Once | INTRAMUSCULAR | Status: AC | PRN
  Administered 2020-02-19: 75 mL via INTRAVENOUS

## 2020-02-19 MED ORDER — LIDOCAINE HCL 1 % IJ SOLN
INTRAMUSCULAR | Status: AC
  Filled 2020-02-19: qty 20

## 2020-02-19 NOTE — Procedures (Signed)
PROCEDURE SUMMARY:  Successful US guided right thoracentesis. Yielded 1.6 L of serosanguinous pleural fluid. Pt tolerated procedure well. No immediate complications.  Specimen was sent for labs. CXR ordered.  EBL < 5 mL  Brayton El PA-C 02/19/2020 4:12 PM

## 2020-02-19 NOTE — Progress Notes (Signed)
Pulmonary Critical Care Medicine [facility] [place]   PULMONARY CRITICAL CARE SERVICE  PROGRESS NOTE  Date of Service: 02/19/2020  Kristine Herrera  URK:[id number]  DOB: Jan 13, 1941   DOA: 02/09/2020  Referring Physician: Carron Curie, MD  HPI: Kristine Herrera is a 80 y.o. female seen for follow up of Acute on Chronic Respiratory Failure.  Patient is on full support on assist control mode currently on 40% FiO2 with good saturations  Medications: Reviewed on Rounds  Physical Exam:  Vitals: Temperature is 98.1 pulse 62 respiratory 39 blood pressure is 109/53 saturations 100%  Ventilator Settings on assist control FiO2 40% tidal volume 450 PEEP of five  . General: Comfortable at this time . Eyes: Grossly normal lids, irises & conjunctiva . ENT: grossly tongue is normal . Neck: no obvious mass . Cardiovascular: S1 S2 normal no gallop . Respiratory: No rhonchi very coarse breath sounds . Abdomen: soft . Skin: no rash seen on limited exam . Musculoskeletal: not rigid . Psychiatric:unable to assess . Neurologic: no seizure no involuntary movements         Lab Data:   Basic Metabolic Panel: Recent Labs  Lab 02/13/20 0604 02/14/20 0315 02/15/20 0515 02/17/20 0415 02/18/20 0407 02/19/20 0650  NA 145 144 143 142  --  140  K 3.6 3.9 4.0 3.2* 3.8 3.7  CL 101 100 99 97*  --  99  CO2 34* 34* 30 35*  --  31  GLUCOSE 160* 117* 142* 141*  --  142*  BUN 33* 34* 38* 39*  --  39*  CREATININE 0.62 0.69 0.70 0.68  --  0.66  CALCIUM 8.1* 8.3* 8.5* 8.0*  --  7.9*  MG 2.3 2.4 2.4  --   --  2.3  PHOS 2.8 2.8 3.0  --   --   --     ABG: No results for input(s): PHART, PCO2ART, PO2ART, HCO3, O2SAT in the last 168 hours.  Liver Function Tests: No results for input(s): AST, ALT, ALKPHOS, BILITOT, PROT, ALBUMIN in the last 168 hours. No results for input(s): LIPASE, AMYLASE in the last 168 hours. No results for input(s): AMMONIA in the last 168 hours.  CBC: Recent Labs   Lab 02/14/20 0315 02/15/20 0515 02/17/20 0415 02/18/20 0407 02/19/20 0650  WBC 13.7* 15.8* 10.5 9.3 8.8  HGB 8.7* 9.1* 7.7* 7.4* 7.1*  HCT 29.2* 30.4* 25.4* 25.5* 24.9*  MCV 99.7 97.4 95.5 95.9 95.4  PLT 206 192 217 211 224    Cardiac Enzymes: No results for input(s): CKTOTAL, CKMB, CKMBINDEX, TROPONINI in the last 168 hours.  BNP (last 3 results) Recent Labs    02/11/20 0342  BNP 254.8*    ProBNP (last 3 results) No results for input(s): PROBNP in the last 8760 hours.  Radiological Exams: CT CHEST WO CONTRAST  Result Date: 02/19/2020 CLINICAL DATA:  Respiratory failure, pleural effusion repair of type A aortic dissection 01/12/2020 EXAM: CT CHEST WITHOUT CONTRAST TECHNIQUE: Multidetector CT imaging of the chest was performed following the standard protocol without IV contrast. COMPARISON:  Radiographs most recently 02/17/2020 FINDINGS: Cardiovascular: Markedly limited evaluation of the vasculature and cardiovascular structures in the absence of contrast media. There are postsurgical changes from recent sternotomy and reported ascending thoracic aortic repair. The aortic root and ascending aorta is normal caliber with more focal dilatation at the level of the arch and proximal great vessels with aorta measuring up to 4.6 cm in diameter at this level returning to a more normal caliber  of 3 cm in the proximal descending aorta. There is surrounding stranding and fluid centered in the AP window which appears to be intermediate attenuation (23 Hounsfield units). Cardiac size within normal limits. Three-vessel coronary artery atherosclerosis is seen. Prior left atrial appendage closure. Trace pericardial effusion. Central pulmonary arteries are normal caliber. Luminal assessment precluded. Mediastinum/Nodes: Postsurgical changes with fluid in stranding in the anterior mediastinum. Endotracheal tracheostomy tube tip terminates in the mid trachea, 3.5 cm from the carina. Scattered secretions  noted in the proximal airways. A transesophageal tube is in place with the side port just beyond the GE junction. No acute esophageal abnormality. Lungs/Pleura: There is a large right pleural effusion with essentially complete atelectatic collapse of the right lower lobe and subtotal atelectasis of the right middle lobe. Additional bandlike opacities are present in the right upper lobe reflecting further subsegmental atelectatic change. A small left effusion is present with some adjacent passive atelectasis. Many of the airways are thickened and fluid-filled including several fully opacified airways within the atelectatic lung on the right. Some patchy areas ground-glass and minimal consolidation and are present within the aerated portions of the lungs. No pneumothorax. Upper Abdomen: No acute abnormalities present in the visualized portions of the upper abdomen. Musculoskeletal: No acute osseous abnormality or suspicious osseous lesion. IMPRESSION: 1. Markedly limited evaluation of the vasculature and cardiovascular structures in the absence of contrast media. 2. Postsurgical changes from recent sternotomy and reported ascending thoracic aortic repair. 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 Some surrounding mild stranding as well as nonspecific low to intermediate attenuation fluid is noted in the AP window is feasibly postsurgical, however if further assessment of the vascularity or surgical changes is warranted clinically, a contrasted exam should be considered, particularly given the absence of direct cross-sectional comparison imaging. 3. Large right pleural effusion with essentially complete atelectatic collapse of the right lower lobe and subtotal atelectasis of the right middle lobe. Additional small left pleural effusion with some passive atelectasis as well. 4. Fluid-filled and thickened airways including  several fully opacified airways within the atelectatic portions of the lungs as well as additional patchy ground-glass and consolidation within the aerated lung could reflect a superimposed aspiration or infectious process. 5. Three-vessel coronary artery atherosclerosis. Prior left atrial appendage closure. 6. Endotracheal tracheostomy tube tip terminates in the mid trachea, 3.5 cm from the carina. 7. A transesophageal tube is in place with the side port just beyond the GE junction. These results were called by telephone at the time of interpretation on 02/19/2020 at 4:48 am to provider Dr. Allena Katz, who verbally acknowledged these results. Electronically Signed   By: Kreg Shropshire M.D.   On: 02/19/2020 04:48    Assessment/Plan Active Problems:   Acute on chronic respiratory failure with hypoxia (HCC)   Chronic atrial fibrillation (HCC)   Pneumothorax   Chronic heart failure with preserved ejection fraction (HCC)   Ascending aortic dissection (HCC)   1. Acute on chronic respiratory failure hypoxia we will continue with assist control right now on 40% FiO2. 2. Chronic atrial fibrillation rate is controlled 3. Pneumothorax treated resolved 4. Chronic heart failure preserved ejection fraction supportive care monitor patient's fluid status 5. Ascending aortic dissection status postrepair   I have personally seen and evaluated the patient, evaluated laboratory and imaging results, formulated the assessment and plan and placed orders. The Patient requires high complexity decision making with multiple systems involvement.  Rounds were done with the Respiratory Therapy Director and Staff therapists and discussed with nursing staff also.  Allyne Gee, MD [facility] Pulmonary Critical Care Medicine Sleep Medicine

## 2020-02-20 DIAGNOSIS — I5032 Chronic diastolic (congestive) heart failure: Secondary | ICD-10-CM | POA: Diagnosis not present

## 2020-02-20 DIAGNOSIS — J9621 Acute and chronic respiratory failure with hypoxia: Secondary | ICD-10-CM | POA: Diagnosis not present

## 2020-02-20 DIAGNOSIS — I482 Chronic atrial fibrillation, unspecified: Secondary | ICD-10-CM | POA: Diagnosis not present

## 2020-02-20 DIAGNOSIS — I7101 Dissection of thoracic aorta: Secondary | ICD-10-CM | POA: Diagnosis not present

## 2020-02-20 LAB — BPAM RBC
Blood Product Expiration Date: 202201292359
ISSUE DATE / TIME: 202201141726
Unit Type and Rh: 6200

## 2020-02-20 LAB — CBC
HCT: 26.3 % — ABNORMAL LOW (ref 36.0–46.0)
Hemoglobin: 8.3 g/dL — ABNORMAL LOW (ref 12.0–15.0)
MCH: 28 pg (ref 26.0–34.0)
MCHC: 31.6 g/dL (ref 30.0–36.0)
MCV: 88.9 fL (ref 80.0–100.0)
Platelets: 258 10*3/uL (ref 150–400)
RBC: 2.96 MIL/uL — ABNORMAL LOW (ref 3.87–5.11)
RDW: 20.6 % — ABNORMAL HIGH (ref 11.5–15.5)
WBC: 8.1 10*3/uL (ref 4.0–10.5)
nRBC: 0 % (ref 0.0–0.2)

## 2020-02-20 LAB — GRAM STAIN

## 2020-02-20 LAB — TYPE AND SCREEN
ABO/RH(D): A POS
Antibody Screen: NEGATIVE
Unit division: 0

## 2020-02-20 LAB — C DIFFICILE (CDIFF) QUICK SCRN (NO PCR REFLEX)
C Diff antigen: NEGATIVE
C Diff interpretation: NOT DETECTED
C Diff toxin: NEGATIVE

## 2020-02-20 NOTE — Progress Notes (Signed)
Pulmonary Critical Care Medicine [facility] [place]   PULMONARY CRITICAL CARE SERVICE  PROGRESS NOTE  Date of Service: 02/20/2020  Kristine Herrera  JOA:[id number]  DOB: 1940/07/24   DOA: 02/09/2020  Referring Physician: Carron Curie, MD  HPI: Kristine Herrera is a 80 y.o. female seen for follow up of Acute on Chronic Respiratory Failure.  Patient currently is on pressure support wean with a goal of 8 hours right now is on 12/5  Medications: Reviewed on Rounds  Physical Exam:  Vitals: Temperature is 98.2 pulse 77 respiratory rate 21 blood pressure is 119/56 saturations 100%  Ventilator Settings on pressure support FiO2 40% pressure 12/5  . General: Comfortable at this time . Eyes: Grossly normal lids, irises & conjunctiva . ENT: grossly tongue is normal . Neck: no obvious mass . Cardiovascular: S1 S2 normal no gallop . Respiratory: Scattered rhonchi very coarse breath sounds . Abdomen: soft . Skin: no rash seen on limited exam . Musculoskeletal: not rigid . Psychiatric:unable to assess . Neurologic: no seizure no involuntary movements         Lab Data:   Basic Metabolic Panel: Recent Labs  Lab 02/14/20 0315 02/15/20 0515 02/17/20 0415 02/18/20 0407 02/19/20 0650  NA 144 143 142  --  140  K 3.9 4.0 3.2* 3.8 3.7  CL 100 99 97*  --  99  CO2 34* 30 35*  --  31  GLUCOSE 117* 142* 141*  --  142*  BUN 34* 38* 39*  --  39*  CREATININE 0.69 0.70 0.68  --  0.66  CALCIUM 8.3* 8.5* 8.0*  --  7.9*  MG 2.4 2.4  --   --  2.3  PHOS 2.8 3.0  --   --   --     ABG: No results for input(s): PHART, PCO2ART, PO2ART, HCO3, O2SAT in the last 168 hours.  Liver Function Tests: No results for input(s): AST, ALT, ALKPHOS, BILITOT, PROT, ALBUMIN in the last 168 hours. No results for input(s): LIPASE, AMYLASE in the last 168 hours. No results for input(s): AMMONIA in the last 168 hours.  CBC: Recent Labs  Lab 02/15/20 0515 02/17/20 0415 02/18/20 0407 02/19/20 0650  02/20/20 0424  WBC 15.8* 10.5 9.3 8.8 8.1  HGB 9.1* 7.7* 7.4* 7.1* 8.3*  HCT 30.4* 25.4* 25.5* 24.9* 26.3*  MCV 97.4 95.5 95.9 95.4 88.9  PLT 192 217 211 224 258    Cardiac Enzymes: No results for input(s): CKTOTAL, CKMB, CKMBINDEX, TROPONINI in the last 168 hours.  BNP (last 3 results) Recent Labs    02/11/20 0342  BNP 254.8*    ProBNP (last 3 results) No results for input(s): PROBNP in the last 8760 hours.  Radiological Exams: CT CHEST WO CONTRAST  Result Date: 02/19/2020 CLINICAL DATA:  Respiratory failure, pleural effusion repair of type A aortic dissection 01/12/2020 EXAM: CT CHEST WITHOUT CONTRAST TECHNIQUE: Multidetector CT imaging of the chest was performed following the standard protocol without IV contrast. COMPARISON:  Radiographs most recently 02/17/2020 FINDINGS: Cardiovascular: Markedly limited evaluation of the vasculature and cardiovascular structures in the absence of contrast media. There are postsurgical changes from recent sternotomy and reported ascending thoracic aortic repair. The aortic root and ascending aorta is normal caliber with more focal dilatation at the level of the arch and proximal great vessels with aorta measuring up to 4.6 cm in diameter at this level returning to a more normal caliber of 3 cm in the proximal descending aorta. There is surrounding stranding and  fluid centered in the AP window which appears to be intermediate attenuation (23 Hounsfield units). Cardiac size within normal limits. Three-vessel coronary artery atherosclerosis is seen. Prior left atrial appendage closure. Trace pericardial effusion. Central pulmonary arteries are normal caliber. Luminal assessment precluded. Mediastinum/Nodes: Postsurgical changes with fluid in stranding in the anterior mediastinum. Endotracheal tracheostomy tube tip terminates in the mid trachea, 3.5 cm from the carina. Scattered secretions noted in the proximal airways. A transesophageal tube is in place  with the side port just beyond the GE junction. No acute esophageal abnormality. Lungs/Pleura: There is a large right pleural effusion with essentially complete atelectatic collapse of the right lower lobe and subtotal atelectasis of the right middle lobe. Additional bandlike opacities are present in the right upper lobe reflecting further subsegmental atelectatic change. A small left effusion is present with some adjacent passive atelectasis. Many of the airways are thickened and fluid-filled including several fully opacified airways within the atelectatic lung on the right. Some patchy areas ground-glass and minimal consolidation and are present within the aerated portions of the lungs. No pneumothorax. Upper Abdomen: No acute abnormalities present in the visualized portions of the upper abdomen. Musculoskeletal: No acute osseous abnormality or suspicious osseous lesion. IMPRESSION: 1. Markedly limited evaluation of the vasculature and cardiovascular structures in the absence of contrast media. 2. Postsurgical changes from recent sternotomy and reported ascending thoracic aortic repair. 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 Some surrounding mild stranding as well as nonspecific low to intermediate attenuation fluid is noted in the AP window is feasibly postsurgical, however if further assessment of the vascularity or surgical changes is warranted clinically, a contrasted exam should be considered, particularly given the absence of direct cross-sectional comparison imaging. 3. Large right pleural effusion with essentially complete atelectatic collapse of the right lower lobe and subtotal atelectasis of the right middle lobe. Additional small left pleural effusion with some passive atelectasis as well. 4. Fluid-filled and thickened airways including several fully opacified airways within the atelectatic portions of the  lungs as well as additional patchy ground-glass and consolidation within the aerated lung could reflect a superimposed aspiration or infectious process. 5. Three-vessel coronary artery atherosclerosis. Prior left atrial appendage closure. 6. Endotracheal tracheostomy tube tip terminates in the mid trachea, 3.5 cm from the carina. 7. A transesophageal tube is in place with the side port just beyond the GE junction. These results were called by telephone at the time of interpretation on 02/19/2020 at 4:48 am to provider Dr. Allena Katz, who verbally acknowledged these results. Electronically Signed   By: Kreg Shropshire M.D.   On: 02/19/2020 04:48   CT ABDOMEN PELVIS W CONTRAST  Result Date: 02/19/2020 CLINICAL DATA:  Lower GI bleed EXAM: CT ABDOMEN AND PELVIS WITH CONTRAST TECHNIQUE: Multidetector CT imaging of the abdomen and pelvis was performed using the standard protocol following bolus administration of intravenous contrast. CONTRAST:  27mL OMNIPAQUE IOHEXOL 300 MG/ML  SOLN COMPARISON:  Report from CT abdomen from 07/25/2007 FINDINGS: Lower chest: Moderate bilateral pleural effusions (right greater than left) with associated passive atelectasis. Moderate cardiomegaly with coronary and aortic atherosclerosis. Bandlike atelectasis in the right middle lobe and lingula. Median sternotomy from recent aortic dissection repair. Hepatobiliary: History 0.7 cm hypodense lesion posteriorly in segment 2 of the liver on image 18 of series 3, probably a cyst or similar benign lesion. Slightly lobulated liver margins, difficult to exclude early cirrhosis. Cholecystectomy. No significant biliary  dilatation. Pancreas: Unremarkable Spleen: Unremarkable Adrenals/Urinary Tract: Streak artifact from the patient's arm positioning partially obscures the kidneys. No hydronephrosis. The left distal ureter is difficult separate from a 4.9 by 2.7 by 4.3 cm left adnexal cystic lesion, although I am skeptical that this represents a focally  dilated segment of the ureter. Stomach/Bowel: A feeding tube terminates in the transverse duodenum. Normal appendix. Orally administered contrast extends through to the rectum, with no dilated bowel identified. There is considerable abnormal diffuse rectal wall thickening along with presacral and perirectal stranding. This wall thickening involves part of the distal sigmoid colon and extends over an approximately 18 cm segment to the anus. No gas is evident in the colon wall nor is there extraluminal gas in this vicinity. Vascular/Lymphatic: Aortoiliac atherosclerotic vascular disease. Patent celiac trunk, SMA, and inferior mesenteric artery. No pathologic adenopathy identified. Reproductive: 4.9 by 2.7 by 4.3 cm left adnexal cystic lesion. Possibilities include ovarian cyst, adnexal cyst, or hydrosalpinx. A similar right-sided adnexal cystic lesion measures 1.9 by 3.1 by 2.3 cm and is probably from hydrosalpinx given the configuration. The endometrium measures 1.3 cm in thickness on image 77 of series 7, abnormally prominent. Other: We partially include a 2.2 by 1.9 cm structure anterior to the right proximal superficial femoral vasculature, query recent right groin puncture versus a dilated venous structure. Musculoskeletal: Mild lower lumbar spondylosis and degenerative disc disease. Mild degenerative arthropathy of both hips. IMPRESSION: 1. Considerable diffuse rectal wall thickening with presacral and perirectal stranding, compatible with proctitis. This wall thickening involves part of the distal sigmoid colon and extends over an approximately 18 cm segment to the anus. No gas is evident in the colon wall nor there is extraluminal gas in this vicinity. 2. Moderate bilateral pleural effusions (right greater than left) with associated passive atelectasis. 3. Moderate cardiomegaly with coronary and aortic atherosclerosis. Median sternotomy from recent aortic dissection repair. 4. Slightly lobulated liver margins,  difficult to exclude early cirrhosis. 5. Bilateral adnexal cystic lesions. Possibilities include ovarian cyst, adnexal cyst, or hydrosalpinx. A similar right-sided cystic lesion measures 1.9 by 3.1 by 2.3 cm and is probably from hydrosalpinx given the configuration. There is also abnormal potential thickening of the endometrium at 1.3 cm. Pelvic sonography is recommended for further characterization. 6. We partially include a 2.2 by 1.9 cm structure anterior to the right proximal superficial femoral vasculature, query recent right groin puncture versus a dilated venous structure. 7. Aortic atherosclerosis. Aortic Atherosclerosis (ICD10-I70.0). Electronically Signed   By: Gaylyn RongWalter  Liebkemann M.D.   On: 02/19/2020 16:59   DG Chest Port 1 View  Result Date: 02/19/2020 CLINICAL DATA:  Status post right thoracentesis. EXAM: PORTABLE CHEST 1 VIEW COMPARISON:  February 17, 2020. FINDINGS: Stable cardiomegaly. Tracheostomy and feeding tubes are unchanged in position. No pneumothorax is noted. Right pleural effusion is significantly smaller. Stable bibasilar atelectasis is noted. Bony thorax is unremarkable. IMPRESSION: Right pleural effusion is significantly smaller. No pneumothorax is noted. Stable bibasilar atelectasis. Stable support apparatus. Aortic Atherosclerosis (ICD10-I70.0). Electronically Signed   By: Lupita RaiderJames  Green Jr M.D.   On: 02/19/2020 16:24   IR THORACENTESIS ASP PLEURAL SPACE W/IMG GUIDE  Result Date: 02/19/2020 INDICATION: Shortness of breath. Respiratory failure. Right-sided pleural effusion. Request for diagnostic and therapeutic thoracentesis. EXAM: ULTRASOUND GUIDED RIGHT THORACENTESIS MEDICATIONS: 1% plain lidocaine, 5 mL COMPLICATIONS: None immediate. PROCEDURE: An ultrasound guided thoracentesis was thoroughly discussed with the patient and questions answered. The benefits, risks, alternatives and complications were also discussed. The patient understands and wishes to proceed  with the  procedure. Written consent was obtained. Ultrasound was performed to localize and mark an adequate pocket of fluid in the right chest. The area was then prepped and draped in the normal sterile fashion. 1% Lidocaine was used for local anesthesia. Under ultrasound guidance a 6 Fr Safe-T-Centesis catheter was introduced. Thoracentesis was performed. The catheter was removed and a dressing applied. FINDINGS: A total of approximately 1.6 L of serosanguineous pleural fluid was removed. Samples were sent to the laboratory as requested by the clinical team. IMPRESSION: Successful ultrasound guided right thoracentesis yielding 1.6 L of pleural fluid. Read by: Brayton El PA-C Electronically Signed   By: Malachy Moan M.D.   On: 02/19/2020 16:25    Assessment/Plan Active Problems:   Acute on chronic respiratory failure with hypoxia (HCC)   Chronic atrial fibrillation (HCC)   Pneumothorax   Chronic heart failure with preserved ejection fraction (HCC)   Ascending aortic dissection (HCC)   1. Acute on chronic respiratory failure hypoxia we will continue with pressure support wean goal of 8 hours 2. Chronic atrial fibrillation rate is controlled 3. Pneumothorax no change 4. Chronic heart failure preserved ejection fraction patient is at baseline 5. Ascending aortic aneurysm status postrepair   I have personally seen and evaluated the patient, evaluated laboratory and imaging results, formulated the assessment and plan and placed orders. The Patient requires high complexity decision making with multiple systems involvement.  Rounds were done with the Respiratory Therapy Director and Staff therapists and discussed with nursing staff also.  Yevonne Pax, MD [facility] Pulmonary Critical Care Medicine Sleep Medicine

## 2020-02-22 DIAGNOSIS — I5032 Chronic diastolic (congestive) heart failure: Secondary | ICD-10-CM | POA: Diagnosis not present

## 2020-02-22 DIAGNOSIS — I482 Chronic atrial fibrillation, unspecified: Secondary | ICD-10-CM | POA: Diagnosis not present

## 2020-02-22 DIAGNOSIS — J9621 Acute and chronic respiratory failure with hypoxia: Secondary | ICD-10-CM | POA: Diagnosis not present

## 2020-02-22 DIAGNOSIS — I7101 Dissection of thoracic aorta: Secondary | ICD-10-CM | POA: Diagnosis not present

## 2020-02-22 LAB — CBC
HCT: 27.3 % — ABNORMAL LOW (ref 36.0–46.0)
Hemoglobin: 8 g/dL — ABNORMAL LOW (ref 12.0–15.0)
MCH: 26.5 pg (ref 26.0–34.0)
MCHC: 29.3 g/dL — ABNORMAL LOW (ref 30.0–36.0)
MCV: 90.4 fL (ref 80.0–100.0)
Platelets: 266 10*3/uL (ref 150–400)
RBC: 3.02 MIL/uL — ABNORMAL LOW (ref 3.87–5.11)
RDW: 18.9 % — ABNORMAL HIGH (ref 11.5–15.5)
WBC: 7.8 10*3/uL (ref 4.0–10.5)
nRBC: 0 % (ref 0.0–0.2)

## 2020-02-22 LAB — BASIC METABOLIC PANEL
Anion gap: 8 (ref 5–15)
BUN: 34 mg/dL — ABNORMAL HIGH (ref 8–23)
CO2: 33 mmol/L — ABNORMAL HIGH (ref 22–32)
Calcium: 7.9 mg/dL — ABNORMAL LOW (ref 8.9–10.3)
Chloride: 97 mmol/L — ABNORMAL LOW (ref 98–111)
Creatinine, Ser: 0.66 mg/dL (ref 0.44–1.00)
GFR, Estimated: >60 mL/min (ref >60)
Glucose, Bld: 129 mg/dL — ABNORMAL HIGH (ref 70–99)
Potassium: 3.7 mmol/L (ref 3.5–5.1)
Sodium: 138 mmol/L (ref 135–145)

## 2020-02-22 LAB — PH, BODY FLUID: pH, Body Fluid: 7.6

## 2020-02-22 LAB — MAGNESIUM: Magnesium: 2.3 mg/dL (ref 1.7–2.4)

## 2020-02-22 NOTE — Progress Notes (Signed)
Pulmonary Critical Care Medicine [facility] [place]   PULMONARY CRITICAL CARE SERVICE  PROGRESS NOTE  Date of Service: 02/22/2020  Kristine Herrera  KYH:[id number]  DOB: [birth year]   DOA: 02/09/2020  Referring Physician: Carron Curie, MD  HPI: Kristine Herrera is a 80 y.o. female seen for follow up of Acute on Chronic Respiratory Failure.  Patient right now is on pressure support has been on 28% FiO2 currently on a pressure of 12/5  Medications: Reviewed on Rounds  Physical Exam:  Vitals: Temperature is 98.2 pulse 60 respiratory 19 blood pressure is 124/72 saturations 98%  Ventilator Settings on pressure support FiO2 is 28% pressure 12/5  . General: Comfortable at this time . Eyes: Grossly normal lids, irises & conjunctiva . ENT: grossly tongue is normal . Neck: no obvious mass . Cardiovascular: S1 S2 normal no gallop . Respiratory: No rhonchi very coarse breath sound . Abdomen: soft . Skin: no rash seen on limited exam . Musculoskeletal: not rigid . Psychiatric:unable to assess . Neurologic: no seizure no involuntary movements         Lab Data:   Basic Metabolic Panel: Recent Labs  Lab 02/17/20 0415 02/18/20 0407 02/19/20 0650 02/22/20 0349  NA 142  --  140 138  K 3.2* 3.8 3.7 3.7  CL 97*  --  99 97*  CO2 35*  --  31 33*  GLUCOSE 141*  --  142* 129*  BUN 39*  --  39* 34*  CREATININE 0.68  --  0.66 0.66  CALCIUM 8.0*  --  7.9* 7.9*  MG  --   --  2.3 2.3    ABG: No results for input(s): PHART, PCO2ART, PO2ART, HCO3, O2SAT in the last 168 hours.  Liver Function Tests: No results for input(s): AST, ALT, ALKPHOS, BILITOT, PROT, ALBUMIN in the last 168 hours. No results for input(s): LIPASE, AMYLASE in the last 168 hours. No results for input(s): AMMONIA in the last 168 hours.  CBC: Recent Labs  Lab 02/17/20 0415 02/18/20 0407 02/19/20 0650 02/20/20 0424 02/22/20 0349  WBC 10.5 9.3 8.8 8.1 7.8  HGB 7.7* 7.4* 7.1* 8.3* 8.0*  HCT 25.4* 25.5*  24.9* 26.3* 27.3*  MCV 95.5 95.9 95.4 88.9 90.4  PLT 217 211 224 258 266    Cardiac Enzymes: No results for input(s): CKTOTAL, CKMB, CKMBINDEX, TROPONINI in the last 168 hours.  BNP (last 3 results) Recent Labs    02/11/20 0342  BNP 254.8*    ProBNP (last 3 results) No results for input(s): PROBNP in the last 8760 hours.  Radiological Exams: No results found.  Assessment/Plan Active Problems:   Acute on chronic respiratory failure with hypoxia (HCC)   Chronic atrial fibrillation (HCC)   Pneumothorax   Chronic heart failure with preserved ejection fraction (HCC)   Ascending aortic dissection (HCC)   1. Acute on chronic respiratory failure hypoxia we will continue with pressure support patient right now is on a goal of 16 hours 2. Chronic atrial fibrillation rate is controlled 3. Pneumothorax no change 4. Chronic heart failure preserved ejection fraction compensated 5. Ascending aortic dissection status postrepair   I have personally seen and evaluated the patient, evaluated laboratory and imaging results, formulated the assessment and plan and placed orders. The Patient requires high complexity decision making with multiple systems involvement.  Rounds were done with the Respiratory Therapy Director and Staff therapists and discussed with nursing staff also.  Yevonne Pax, MD [facility] Pulmonary Critical Care Medicine Sleep Medicine

## 2020-02-23 ENCOUNTER — Other Ambulatory Visit (HOSPITAL_COMMUNITY): Payer: Medicare Other

## 2020-02-23 DIAGNOSIS — I482 Chronic atrial fibrillation, unspecified: Secondary | ICD-10-CM | POA: Diagnosis not present

## 2020-02-23 DIAGNOSIS — J9621 Acute and chronic respiratory failure with hypoxia: Secondary | ICD-10-CM | POA: Diagnosis not present

## 2020-02-23 DIAGNOSIS — I5032 Chronic diastolic (congestive) heart failure: Secondary | ICD-10-CM | POA: Diagnosis not present

## 2020-02-23 DIAGNOSIS — I7101 Dissection of thoracic aorta: Secondary | ICD-10-CM | POA: Diagnosis not present

## 2020-02-23 LAB — CYTOLOGY - NON PAP

## 2020-02-23 NOTE — Consult Note (Signed)
Ref: Elsie Amis, MD   Subjective:  Feeling better.  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 Right pleural effusion with right lower lobe collapse and right middle lobe atelectasis.  Objective:  Vital Signs in the last 24 hours:  P: 72, R: 17, BP: 120's/60's  O2 sat: 98 % on 28 % FiO2, 450 TV and 5 of PEEP.  Physical Exam: BP Readings from Last 1 Encounters:  No data found for BP     Wt Readings from Last 1 Encounters:  No data found for Wt    Weight change:  There is no height or weight on file to calculate BMI. HEENT: [place]/AT, Eyes-Blue, Conjunctiva-Pale, Sclera-Non-icteric Neck: No JVD, No bruit, Tracheostomy tube in place.  Lungs:  Scattered rhonchi, Bilateral. Cardiac:  Regular rhythm, normal S1 and S2, no S3. II/VI systolic murmur. Abdomen:  Soft, non-tender. BS present. Extremities:  No edema present. No cyanosis. No clubbing. CNS: AxOx3, Cranial nerves grossly intact, moves all 4 extremities.  Skin: Warm and dry.   Intake/Output from previous day: No intake/output data recorded.    Lab Results: BMET    Component Value Date/Time   NA 138 02/22/2020 0349   NA 140 02/19/2020 0650   NA 142 02/17/2020 0415   K 3.7 02/22/2020 0349   K 3.7 02/19/2020 0650   K 3.8 02/18/2020 0407   CL 97 (L) 02/22/2020 0349   CL 99 02/19/2020 0650   CL 97 (L) 02/17/2020 0415   CO2 33 (H) 02/22/2020 0349   CO2 31 02/19/2020 0650   CO2 35 (H) 02/17/2020 0415   GLUCOSE 129 (H) 02/22/2020 0349   GLUCOSE 142 (H) 02/19/2020 0650   GLUCOSE 141 (H) 02/17/2020 0415   BUN 34 (H) 02/22/2020 0349   BUN 39 (H) 02/19/2020 0650   BUN 39 (H) 02/17/2020 0415   CREATININE 0.66 02/22/2020 0349   CREATININE 0.66 02/19/2020 0650   CREATININE 0.68 02/17/2020 0415   CALCIUM 7.9 (L) 02/22/2020 0349   CALCIUM 7.9 (L) 02/19/2020 0650   CALCIUM 8.0 (L) 02/17/2020 0415   GFRNONAA >60 02/22/2020 0349   GFRNONAA >60 02/19/2020 0650   GFRNONAA >60  02/17/2020 0415   CBC    Component Value Date/Time   WBC 7.8 02/22/2020 0349   RBC 3.02 (L) 02/22/2020 0349   HGB 8.0 (L) 02/22/2020 0349   HCT 27.3 (L) 02/22/2020 0349   PLT 266 02/22/2020 0349   MCV 90.4 02/22/2020 0349   MCH 26.5 02/22/2020 0349   MCHC 29.3 (L) 02/22/2020 0349   RDW 18.9 (H) 02/22/2020 0349   HEPATIC Function Panel Recent Labs    02/11/20 0342  PROT 6.6   HEMOGLOBIN A1C No components found for: HGA1C,  MPG CARDIAC ENZYMES No results found for: CKTOTAL, CKMB, CKMBINDEX, TROPONINI BNP No results for input(s): PROBNP in the last 8760 hours. TSH Recent Labs    02/11/20 0342  TSH 6.820*   CHOLESTEROL No results for input(s): CHOL in the last 8760 hours.  Scheduled Meds: Continuous Infusions: PRN Meds:.lidocaine  Assessment/Plan: Acute on chronic respiratory failure with hypoxia Paroxysmal atrial fibrillation Sinus bradycardia S/P type A aortic dissection with repair S/P LA appendage clip HTN Hyperlipidemia T1 fracture PVD with left toes gangrene  Decrease metoprolol 33 % to 50 % as needed to improve heart rate. Continue amiodarone. 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   LOS: 0 days   Time spent including chart review,  lab review, examination, discussion with patient/Nurse/PA : 30 min   Orpah Cobb  MD  02/23/2020, 1:55 PM

## 2020-02-23 NOTE — Progress Notes (Signed)
Pulmonary Critical Care Medicine [facility] [place]   PULMONARY CRITICAL CARE SERVICE  PROGRESS NOTE  Date of Service: 02/23/2020  NAYALI TALERICO  HLK:[id number]  DOB: 16-Nov-1940   DOA: 02/09/2020  Referring Physician: Carron Curie, MD  HPI: Turquoise Esch Mcclenny is a 80 y.o. female seen for follow up of Acute on Chronic Respiratory Failure.  Patient right now is on T collar has been on 35% FiO2 good saturations are noted  Medications: Reviewed on Rounds  Physical Exam:  Vitals: Temperature is 98.9 pulse 59 respiratory rate 22 blood pressure is 127/56 saturations 100%  Ventilator Settings on T collar with an FiO2 of 35%  . General: Comfortable at this time . Eyes: Grossly normal lids, irises & conjunctiva . ENT: grossly tongue is normal . Neck: no obvious mass . Cardiovascular: S1 S2 normal no gallop . Respiratory: No rhonchi very coarse . Abdomen: soft . Skin: no rash seen on limited exam . Musculoskeletal: not rigid . Psychiatric:unable to assess . Neurologic: no seizure no involuntary movements         Lab Data:   Basic Metabolic Panel: Recent Labs  Lab 02/17/20 0415 02/18/20 0407 02/19/20 0650 02/22/20 0349  NA 142  --  140 138  K 3.2* 3.8 3.7 3.7  CL 97*  --  99 97*  CO2 35*  --  31 33*  GLUCOSE 141*  --  142* 129*  BUN 39*  --  39* 34*  CREATININE 0.68  --  0.66 0.66  CALCIUM 8.0*  --  7.9* 7.9*  MG  --   --  2.3 2.3    ABG: No results for input(s): PHART, PCO2ART, PO2ART, HCO3, O2SAT in the last 168 hours.  Liver Function Tests: No results for input(s): AST, ALT, ALKPHOS, BILITOT, PROT, ALBUMIN in the last 168 hours. No results for input(s): LIPASE, AMYLASE in the last 168 hours. No results for input(s): AMMONIA in the last 168 hours.  CBC: Recent Labs  Lab 02/17/20 0415 02/18/20 0407 02/19/20 0650 02/20/20 0424 02/22/20 0349  WBC 10.5 9.3 8.8 8.1 7.8  HGB 7.7* 7.4* 7.1* 8.3* 8.0*  HCT 25.4* 25.5* 24.9* 26.3* 27.3*  MCV 95.5 95.9  95.4 88.9 90.4  PLT 217 211 224 258 266    Cardiac Enzymes: No results for input(s): CKTOTAL, CKMB, CKMBINDEX, TROPONINI in the last 168 hours.  BNP (last 3 results) Recent Labs    02/11/20 0342  BNP 254.8*    ProBNP (last 3 results) No results for input(s): PROBNP in the last 8760 hours.  Radiological Exams: No results found.  Assessment/Plan Active Problems:   Acute on chronic respiratory failure with hypoxia (HCC)   Chronic atrial fibrillation (HCC)   Pneumothorax   Chronic heart failure with preserved ejection fraction (HCC)   Ascending aortic dissection (HCC)   1. Acute on chronic respiratory failure with hypoxia we will continue with the T-piece patient is on a 2-hour goal 2. Chronic atrial fibrillation rate is controlled 3. Pneumothorax treated. 4. Chronic heart failure preserved ejection fraction at baseline 5. Ascending aortic dissection no change   I have personally seen and evaluated the patient, evaluated laboratory and imaging results, formulated the assessment and plan and placed orders. The Patient requires high complexity decision making with multiple systems involvement.  Rounds were done with the Respiratory Therapy Director and Staff therapists and discussed with nursing staff also.  Yevonne Pax, MD [facility] Pulmonary Critical Care Medicine Sleep Medicine

## 2020-02-24 ENCOUNTER — Other Ambulatory Visit (HOSPITAL_COMMUNITY): Payer: Medicare Other

## 2020-02-24 DIAGNOSIS — J9621 Acute and chronic respiratory failure with hypoxia: Secondary | ICD-10-CM | POA: Diagnosis not present

## 2020-02-24 DIAGNOSIS — I5032 Chronic diastolic (congestive) heart failure: Secondary | ICD-10-CM | POA: Diagnosis not present

## 2020-02-24 DIAGNOSIS — I7101 Dissection of thoracic aorta: Secondary | ICD-10-CM | POA: Diagnosis not present

## 2020-02-24 DIAGNOSIS — I482 Chronic atrial fibrillation, unspecified: Secondary | ICD-10-CM | POA: Diagnosis not present

## 2020-02-24 LAB — BASIC METABOLIC PANEL
Anion gap: 9 (ref 5–15)
BUN: 25 mg/dL — ABNORMAL HIGH (ref 8–23)
CO2: 32 mmol/L (ref 22–32)
Calcium: 7.8 mg/dL — ABNORMAL LOW (ref 8.9–10.3)
Chloride: 97 mmol/L — ABNORMAL LOW (ref 98–111)
Creatinine, Ser: 0.61 mg/dL (ref 0.44–1.00)
GFR, Estimated: >60 mL/min (ref >60)
Glucose, Bld: 122 mg/dL — ABNORMAL HIGH (ref 70–99)
Potassium: 3.2 mmol/L — ABNORMAL LOW (ref 3.5–5.1)
Sodium: 138 mmol/L (ref 135–145)

## 2020-02-24 LAB — CULTURE, BODY FLUID W GRAM STAIN -BOTTLE: Culture: NO GROWTH

## 2020-02-24 LAB — CBC
HCT: 29.1 % — ABNORMAL LOW (ref 36.0–46.0)
Hemoglobin: 8.7 g/dL — ABNORMAL LOW (ref 12.0–15.0)
MCH: 26.8 pg (ref 26.0–34.0)
MCHC: 29.9 g/dL — ABNORMAL LOW (ref 30.0–36.0)
MCV: 89.5 fL (ref 80.0–100.0)
Platelets: 299 10*3/uL (ref 150–400)
RBC: 3.25 MIL/uL — ABNORMAL LOW (ref 3.87–5.11)
RDW: 18.5 % — ABNORMAL HIGH (ref 11.5–15.5)
WBC: 8.3 10*3/uL (ref 4.0–10.5)
nRBC: 0 % (ref 0.0–0.2)

## 2020-02-24 LAB — PHOSPHORUS: Phosphorus: 3 mg/dL (ref 2.5–4.6)

## 2020-02-24 LAB — MAGNESIUM: Magnesium: 2.2 mg/dL (ref 1.7–2.4)

## 2020-02-24 NOTE — Progress Notes (Signed)
Pulmonary Critical Care Medicine [facility] [place]   PULMONARY CRITICAL CARE SERVICE  PROGRESS NOTE  Date of Service: 02/24/2020  Kristine Herrera  LOV:[id number]  DOB: 08-21-1940   DOA: 02/09/2020  Referring Physician: Carron Curie, MD  HPI: Kristine Herrera is a 80 y.o. female seen for follow up of Acute on Chronic Respiratory Failure.  Patient currently is on T collar has been on 28% FiO2 patient has been on a goal of 4 hours  Medications: Reviewed on Rounds  Physical Exam:  Vitals: Temperature is 97.8 pulse 64 respiratory rate 16 blood pressure is 118/59 saturations 100%  Ventilator Settings on T collar FiO2 28%  . General: Comfortable at this time . Eyes: Grossly normal lids, irises & conjunctiva . ENT: grossly tongue is normal . Neck: no obvious mass . Cardiovascular: S1 S2 normal no gallop . Respiratory: No rhonchi coarse breath sounds . Abdomen: soft . Skin: no rash seen on limited exam . Musculoskeletal: not rigid . Psychiatric:unable to assess . Neurologic: no seizure no involuntary movements         Lab Data:   Basic Metabolic Panel: Recent Labs  Lab 02/18/20 0407 02/19/20 0650 02/22/20 0349 02/24/20 0807  NA  --  140 138 138  K 3.8 3.7 3.7 3.2*  CL  --  99 97* 97*  CO2  --  31 33* 32  GLUCOSE  --  142* 129* 122*  BUN  --  39* 34* 25*  CREATININE  --  0.66 0.66 0.61  CALCIUM  --  7.9* 7.9* 7.8*  MG  --  2.3 2.3 2.2  PHOS  --   --   --  3.0    ABG: No results for input(s): PHART, PCO2ART, PO2ART, HCO3, O2SAT in the last 168 hours.  Liver Function Tests: No results for input(s): AST, ALT, ALKPHOS, BILITOT, PROT, ALBUMIN in the last 168 hours. No results for input(s): LIPASE, AMYLASE in the last 168 hours. No results for input(s): AMMONIA in the last 168 hours.  CBC: Recent Labs  Lab 02/18/20 0407 02/19/20 0650 02/20/20 0424 02/22/20 0349 02/24/20 0807  WBC 9.3 8.8 8.1 7.8 8.3  HGB 7.4* 7.1* 8.3* 8.0* 8.7*  HCT 25.5* 24.9*  26.3* 27.3* 29.1*  MCV 95.9 95.4 88.9 90.4 89.5  PLT 211 224 258 266 299    Cardiac Enzymes: No results for input(s): CKTOTAL, CKMB, CKMBINDEX, TROPONINI in the last 168 hours.  BNP (last 3 results) Recent Labs    02/11/20 0342  BNP 254.8*    ProBNP (last 3 results) No results for input(s): PROBNP in the last 8760 hours.  Radiological Exams: DG Abd 1 View  Result Date: 02/23/2020 CLINICAL DATA:  NG tube placement EXAM: ABDOMEN - 1 VIEW COMPARISON:  None. FINDINGS: The bowel gas pattern is normal. Tip the NG tube is seen within the proximal stomach. No radio-opaque calculi or other significant radiographic abnormality are seen. IMPRESSION: Tip the NG tube seen within the proximal stomach Electronically Signed   By: Jonna Clark M.D.   On: 02/23/2020 20:05   DG CHEST PORT 1 VIEW  Result Date: 02/24/2020 CLINICAL DATA:  Respiratory failure. EXAM: PORTABLE CHEST 1 VIEW COMPARISON:  02/19/2020. FINDINGS: Interim removal of feeding tube. Interim placement of NG tube, NG tube tip below left hemidiaphragm. Tracheostomy tube in stable position. Prior median sternotomy. Left atrial appendage clip in stable position. Cardiomegaly. Bibasilar atelectasis. Bilateral pulmonary infiltrates/edema, slightly progressed from prior exam. Stable small right pleural effusion. No pneumothorax. IMPRESSION:  1. Interim removal of feeding tube. Interim placement of the tube. NG tube tip below left hemidiaphragm. Tracheostomy tube in stable position. 2. Prior median sternotomy. Left atrial appendage clip in stable position. Cardiomegaly. 3. Bibasilar atelectasis. Bilateral pulmonary infiltrates/edema, slightly progressed from prior exam. Small right pleural effusion again noted. Electronically Signed   By: Maisie Fus  Register   On: 02/24/2020 07:04    Assessment/Plan Active Problems:   Acute on chronic respiratory failure with hypoxia (HCC)   Chronic atrial fibrillation (HCC)   Pneumothorax   Chronic heart failure  with preserved ejection fraction (HCC)   Ascending aortic dissection (HCC)   1. Acute on chronic respiratory failure hypoxia we will continue with the T collar trials titrate oxygen continue pulmonary toilet. 2. Chronic atrial fibrillation rate is controlled at this time 3. Pneumothorax no change we will continue to follow 4. Chronic heart failure preserved ejection fraction at baseline 5. Aortic dissection status postrepair   I have personally seen and evaluated the patient, evaluated laboratory and imaging results, formulated the assessment and plan and placed orders. The Patient requires high complexity decision making with multiple systems involvement.  Rounds were done with the Respiratory Therapy Director and Staff therapists and discussed with nursing staff also.  Yevonne Pax, MD [facility] Pulmonary Critical Care Medicine Sleep Medicine

## 2020-02-25 DIAGNOSIS — I5032 Chronic diastolic (congestive) heart failure: Secondary | ICD-10-CM | POA: Diagnosis not present

## 2020-02-25 DIAGNOSIS — I7101 Dissection of thoracic aorta: Secondary | ICD-10-CM | POA: Diagnosis not present

## 2020-02-25 DIAGNOSIS — J9621 Acute and chronic respiratory failure with hypoxia: Secondary | ICD-10-CM | POA: Diagnosis not present

## 2020-02-25 DIAGNOSIS — I482 Chronic atrial fibrillation, unspecified: Secondary | ICD-10-CM | POA: Diagnosis not present

## 2020-02-25 LAB — POTASSIUM: Potassium: 3.5 mmol/L (ref 3.5–5.1)

## 2020-02-25 NOTE — Progress Notes (Signed)
Pulmonary Critical Care Medicine [facility] [place]   PULMONARY CRITICAL CARE SERVICE  PROGRESS NOTE  Date of Service: 02/25/2020  Kristine Herrera  ZOX:[id number]  DOB: Jul 18, 1940   DOA: 02/09/2020  Referring Physician: Carron Curie, MD  HPI: Kristine Herrera is a 80 y.o. female seen for follow up of Acute on Chronic Respiratory Failure.  Patient is on pressure support 12/5 supposed to do T collar today with a goal of about 8 hours  Medications: Reviewed on Rounds  Physical Exam:  Vitals: Temperature is 99.3 pulse 77 respiratory 19 blood pressure is 116/47 saturations 98%  Ventilator Settings on pressure support FiO2 28% pressure 12/5  . General: Comfortable at this time . Eyes: Grossly normal lids, irises & conjunctiva . ENT: grossly tongue is normal . Neck: no obvious mass . Cardiovascular: S1 S2 normal no gallop . Respiratory: No rhonchi very coarse breath sound . Abdomen: soft . Skin: no rash seen on limited exam . Musculoskeletal: not rigid . Psychiatric:unable to assess . Neurologic: no seizure no involuntary movements         Lab Data:   Basic Metabolic Panel: Recent Labs  Lab 02/19/20 0650 02/22/20 0349 02/24/20 0807 02/25/20 0620  NA 140 138 138  --   K 3.7 3.7 3.2* 3.5  CL 99 97* 97*  --   CO2 31 33* 32  --   GLUCOSE 142* 129* 122*  --   BUN 39* 34* 25*  --   CREATININE 0.66 0.66 0.61  --   CALCIUM 7.9* 7.9* 7.8*  --   MG 2.3 2.3 2.2  --   PHOS  --   --  3.0  --     ABG: No results for input(s): PHART, PCO2ART, PO2ART, HCO3, O2SAT in the last 168 hours.  Liver Function Tests: No results for input(s): AST, ALT, ALKPHOS, BILITOT, PROT, ALBUMIN in the last 168 hours. No results for input(s): LIPASE, AMYLASE in the last 168 hours. No results for input(s): AMMONIA in the last 168 hours.  CBC: Recent Labs  Lab 02/19/20 0650 02/20/20 0424 02/22/20 0349 02/24/20 0807  WBC 8.8 8.1 7.8 8.3  HGB 7.1* 8.3* 8.0* 8.7*  HCT 24.9* 26.3*  27.3* 29.1*  MCV 95.4 88.9 90.4 89.5  PLT 224 258 266 299    Cardiac Enzymes: No results for input(s): CKTOTAL, CKMB, CKMBINDEX, TROPONINI in the last 168 hours.  BNP (last 3 results) Recent Labs    02/11/20 0342  BNP 254.8*    ProBNP (last 3 results) No results for input(s): PROBNP in the last 8760 hours.  Radiological Exams: DG CHEST PORT 1 VIEW  Result Date: 02/24/2020 CLINICAL DATA:  Respiratory failure. EXAM: PORTABLE CHEST 1 VIEW COMPARISON:  02/19/2020. FINDINGS: Interim removal of feeding tube. Interim placement of NG tube, NG tube tip below left hemidiaphragm. Tracheostomy tube in stable position. Prior median sternotomy. Left atrial appendage clip in stable position. Cardiomegaly. Bibasilar atelectasis. Bilateral pulmonary infiltrates/edema, slightly progressed from prior exam. Stable small right pleural effusion. No pneumothorax. IMPRESSION: 1. Interim removal of feeding tube. Interim placement of the tube. NG tube tip below left hemidiaphragm. Tracheostomy tube in stable position. 2. Prior median sternotomy. Left atrial appendage clip in stable position. Cardiomegaly. 3. Bibasilar atelectasis. Bilateral pulmonary infiltrates/edema, slightly progressed from prior exam. Small right pleural effusion again noted. Electronically Signed   By: Maisie Fus  Register   On: 02/24/2020 07:04    Assessment/Plan Active Problems:   Acute on chronic respiratory failure with hypoxia (HCC)  Chronic atrial fibrillation (HCC)   Pneumothorax   Chronic heart failure with preserved ejection fraction (HCC)   Ascending aortic dissection (HCC)   1. Acute on chronic respiratory failure hypoxia plan is to continue with the pressure support weaning as tolerated. 2. Chronic atrial fibrillation rate now rate is controlled 3. Pneumothorax resolved 4. Chronic heart failure with preserved ejection fraction supportive care 5. Ascending aortic dissection status post repair   I have personally seen and  evaluated the patient, evaluated laboratory and imaging results, formulated the assessment and plan and placed orders. The Patient requires high complexity decision making with multiple systems involvement.  Rounds were done with the Respiratory Therapy Director and Staff therapists and discussed with nursing staff also.  Yevonne Pax, MD [facility] Pulmonary Critical Care Medicine Sleep Medicine

## 2020-02-25 NOTE — Consult Note (Signed)
Chief Complaint: Patient was seen in consultation today for Percutaneous gastric tube placement at the request of Dr Sharyon Medicus   Supervising Physician: Gilmer Mor  Patient Status: [facility]  History of Present Illness: Kristine Herrera is a 80 y.o. female   HTN; ARF; A fib- RVR Aortic dissection-01/12/20- surgery- complicated recovery Respiratory failure Trach 01/21/20 To [facility] for [facility]  Failure to thrive Protein calorie malnutrition Long term care  Request for percutaneous gastric tube placement Imaging reviewed and approved procedure per Dr Loreta Ave    Past Medical History:  Diagnosis Date  . Acute on chronic respiratory failure with hypoxia (HCC)   . Ascending aortic dissection (HCC)   . Chronic atrial fibrillation (HCC)   . Chronic heart failure with preserved ejection fraction (HCC)   . Pneumothorax       Allergies: Patient has no allergy information on record.  Medications: Prior to Admission medications   Not on File     No family history on file.  Social History   Socioeconomic History  . Marital status: Married    Spouse name: Not on file  . Number of children: Not on file  . Years of education: Not on file  . Highest education level: Not on file  Occupational History  . Not on file  Tobacco Use  . Smoking status: Not on file  . Smokeless tobacco: Not on file  Substance and Sexual Activity  . Alcohol use: Not on file  . Drug use: Not on file  . Sexual activity: Not on file  Other Topics Concern  . Not on file  Social History Narrative  . Not on file   Social Determinants of Health   Financial Resource Strain: Not on file  Food Insecurity: Not on file  Transportation Needs: Not on file  Physical Activity: Not on file  Stress: Not on file  Social Connections: Not on file    Review of Systems: A 12 point ROS discussed and pertinent positives are indicated in the HPI above.  All other systems are negative.   Vital  Signs: There were no vitals taken for this visit.  Physical Exam HENT:     Mouth/Throat:     Mouth: Mucous membranes are moist.  Cardiovascular:     Rate and Rhythm: Normal rate. Rhythm irregular.     Heart sounds: No murmur heard.   Pulmonary:     Effort: Pulmonary effort is normal.     Breath sounds: Normal breath sounds.  Abdominal:     Palpations: Abdomen is soft.  Skin:    General: Skin is warm.  Neurological:     Mental Status: She is alert.  Psychiatric:     Comments: Spoke to Dtr [place] via phone Consents to G tube procedure     Imaging: DG Chest 1 View  Result Date: 02/10/2020 CLINICAL DATA:  Respiratory failure, decreased oxygen saturation EXAM: CHEST  1 VIEW COMPARISON:  None. FINDINGS: Single frontal view of the chest demonstrates tracheostomy tube overlying tracheal air column tip at thoracic inlet. Enteric catheter passes below diaphragm tip excluded by collimation. The cardiac silhouette is enlarged. There is central vascular congestion, with right basilar veiling opacity consistent with consolidation and/or effusion. No pneumothorax. No acute bony abnormalities. IMPRESSION: 1. Findings consistent with congestive heart failure, with asymmetric right greater than left edema and effusions. Electronically Signed   By: Sharlet Salina M.D.   On: 02/10/2020 03:20   DG Abd 1 View  Result Date: 02/23/2020 CLINICAL DATA:  NG tube placement EXAM: ABDOMEN - 1 VIEW COMPARISON:  None. FINDINGS: The bowel gas pattern is normal. Tip the NG tube is seen within the proximal stomach. No radio-opaque calculi or other significant radiographic abnormality are seen. IMPRESSION: Tip the NG tube seen within the proximal stomach Electronically Signed   By: Jonna Clark M.D.   On: 02/23/2020 20:05   CT CHEST WO CONTRAST  Result Date: 02/19/2020 CLINICAL DATA:  Respiratory failure, pleural effusion repair of type A aortic dissection 01/12/2020 EXAM: CT CHEST WITHOUT CONTRAST TECHNIQUE:  Multidetector CT imaging of the chest was performed following the standard protocol without IV contrast. COMPARISON:  Radiographs most recently 02/17/2020 FINDINGS: Cardiovascular: Markedly limited evaluation of the vasculature and cardiovascular structures in the absence of contrast media. There are postsurgical changes from recent sternotomy and reported ascending thoracic aortic repair. The aortic root and ascending aorta is normal caliber with more focal dilatation at the level of the arch and proximal great vessels with aorta measuring up to 4.6 cm in diameter at this level returning to a more normal caliber of 3 cm in the proximal descending aorta. There is surrounding stranding and fluid centered in the AP window which appears to be intermediate attenuation (23 Hounsfield units). Cardiac size within normal limits. Three-vessel coronary artery atherosclerosis is seen. Prior left atrial appendage closure. Trace pericardial effusion. Central pulmonary arteries are normal caliber. Luminal assessment precluded. Mediastinum/Nodes: Postsurgical changes with fluid in stranding in the anterior mediastinum. Endotracheal tracheostomy tube tip terminates in the mid trachea, 3.5 cm from the carina. Scattered secretions noted in the proximal airways. A transesophageal tube is in place with the side port just beyond the GE junction. No acute esophageal abnormality. Lungs/Pleura: There is a large right pleural effusion with essentially complete atelectatic collapse of the right lower lobe and subtotal atelectasis of the right middle lobe. Additional bandlike opacities are present in the right upper lobe reflecting further subsegmental atelectatic change. A small left effusion is present with some adjacent passive atelectasis. Many of the airways are thickened and fluid-filled including several fully opacified airways within the atelectatic lung on the right. Some patchy areas ground-glass and minimal consolidation and are  present within the aerated portions of the lungs. No pneumothorax. Upper Abdomen: No acute abnormalities present in the visualized portions of the upper abdomen. Musculoskeletal: No acute osseous abnormality or suspicious osseous lesion. IMPRESSION: 1. Markedly limited evaluation of the vasculature and cardiovascular structures in the absence of contrast media. 2. Postsurgical changes from recent sternotomy and reported ascending thoracic aortic repair. 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 Some surrounding mild stranding as well as nonspecific low to intermediate attenuation fluid is noted in the AP window is feasibly postsurgical, however if further assessment of the vascularity or surgical changes is warranted clinically, a contrasted exam should be considered, particularly given the absence of direct cross-sectional comparison imaging. 3. Large right pleural effusion with essentially complete atelectatic collapse of the right lower lobe and subtotal atelectasis of the right middle lobe. Additional small left pleural effusion with some passive atelectasis as well. 4. Fluid-filled and thickened airways including several fully opacified airways within the atelectatic portions of the lungs as well as additional patchy ground-glass and consolidation within the aerated lung could reflect a superimposed aspiration or infectious process. 5. Three-vessel coronary artery atherosclerosis. Prior left atrial appendage closure. 6. Endotracheal tracheostomy tube tip terminates in the mid trachea, 3.5 cm  from the carina. 7. A transesophageal tube is in place with the side port just beyond the GE junction. These results were called by telephone at the time of interpretation on 02/19/2020 at 4:48 am to provider Dr. Allena KatzPatel, who verbally acknowledged these results. Electronically Signed   By: Kreg ShropshirePrice  DeHay M.D.   On: 02/19/2020 04:48    CT ABDOMEN PELVIS W CONTRAST  Result Date: 02/19/2020 CLINICAL DATA:  Lower GI bleed EXAM: CT ABDOMEN AND PELVIS WITH CONTRAST TECHNIQUE: Multidetector CT imaging of the abdomen and pelvis was performed using the standard protocol following bolus administration of intravenous contrast. CONTRAST:  75mL OMNIPAQUE IOHEXOL 300 MG/ML  SOLN COMPARISON:  Report from CT abdomen from 07/25/2007 FINDINGS: Lower chest: Moderate bilateral pleural effusions (right greater than left) with associated passive atelectasis. Moderate cardiomegaly with coronary and aortic atherosclerosis. Bandlike atelectasis in the right middle lobe and lingula. Median sternotomy from recent aortic dissection repair. Hepatobiliary: History 0.7 cm hypodense lesion posteriorly in segment 2 of the liver on image 18 of series 3, probably a cyst or similar benign lesion. Slightly lobulated liver margins, difficult to exclude early cirrhosis. Cholecystectomy. No significant biliary dilatation. Pancreas: Unremarkable Spleen: Unremarkable Adrenals/Urinary Tract: Streak artifact from the patient's arm positioning partially obscures the kidneys. No hydronephrosis. The left distal ureter is difficult separate from a 4.9 by 2.7 by 4.3 cm left adnexal cystic lesion, although I am skeptical that this represents a focally dilated segment of the ureter. Stomach/Bowel: A feeding tube terminates in the transverse duodenum. Normal appendix. Orally administered contrast extends through to the rectum, with no dilated bowel identified. There is considerable abnormal diffuse rectal wall thickening along with presacral and perirectal stranding. This wall thickening involves part of the distal sigmoid colon and extends over an approximately 18 cm segment to the anus. No gas is evident in the colon wall nor is there extraluminal gas in this vicinity. Vascular/Lymphatic: Aortoiliac atherosclerotic vascular disease. Patent celiac trunk, SMA, and inferior mesenteric artery.  No pathologic adenopathy identified. Reproductive: 4.9 by 2.7 by 4.3 cm left adnexal cystic lesion. Possibilities include ovarian cyst, adnexal cyst, or hydrosalpinx. A similar right-sided adnexal cystic lesion measures 1.9 by 3.1 by 2.3 cm and is probably from hydrosalpinx given the configuration. The endometrium measures 1.3 cm in thickness on image 77 of series 7, abnormally prominent. Other: We partially include a 2.2 by 1.9 cm structure anterior to the right proximal superficial femoral vasculature, query recent right groin puncture versus a dilated venous structure. Musculoskeletal: Mild lower lumbar spondylosis and degenerative disc disease. Mild degenerative arthropathy of both hips. IMPRESSION: 1. Considerable diffuse rectal wall thickening with presacral and perirectal stranding, compatible with proctitis. This wall thickening involves part of the distal sigmoid colon and extends over an approximately 18 cm segment to the anus. No gas is evident in the colon wall nor there is extraluminal gas in this vicinity. 2. Moderate bilateral pleural effusions (right greater than left) with associated passive atelectasis. 3. Moderate cardiomegaly with coronary and aortic atherosclerosis. Median sternotomy from recent aortic dissection repair. 4. Slightly lobulated liver margins, difficult to exclude early cirrhosis. 5. Bilateral adnexal cystic lesions. Possibilities include ovarian cyst, adnexal cyst, or hydrosalpinx. A similar right-sided cystic lesion measures 1.9 by 3.1 by 2.3 cm and is probably from hydrosalpinx given the configuration. There is also abnormal potential thickening of the endometrium at 1.3 cm. Pelvic sonography is recommended for further characterization. 6. We partially include a 2.2 by 1.9 cm structure anterior to the right proximal superficial  femoral vasculature, query recent right groin puncture versus a dilated venous structure. 7. Aortic atherosclerosis. Aortic Atherosclerosis  (ICD10-I70.0). Electronically Signed   By: Gaylyn RongWalter  Liebkemann M.D.   On: 02/19/2020 16:59   DG CHEST PORT 1 VIEW  Result Date: 02/24/2020 CLINICAL DATA:  Respiratory failure. EXAM: PORTABLE CHEST 1 VIEW COMPARISON:  02/19/2020. FINDINGS: Interim removal of feeding tube. Interim placement of NG tube, NG tube tip below left hemidiaphragm. Tracheostomy tube in stable position. Prior median sternotomy. Left atrial appendage clip in stable position. Cardiomegaly. Bibasilar atelectasis. Bilateral pulmonary infiltrates/edema, slightly progressed from prior exam. Stable small right pleural effusion. No pneumothorax. IMPRESSION: 1. Interim removal of feeding tube. Interim placement of the tube. NG tube tip below left hemidiaphragm. Tracheostomy tube in stable position. 2. Prior median sternotomy. Left atrial appendage clip in stable position. Cardiomegaly. 3. Bibasilar atelectasis. Bilateral pulmonary infiltrates/edema, slightly progressed from prior exam. Small right pleural effusion again noted. Electronically Signed   By: Maisie Fushomas  Register   On: 02/24/2020 07:04   DG Chest Port 1 View  Result Date: 02/19/2020 CLINICAL DATA:  Status post right thoracentesis. EXAM: PORTABLE CHEST 1 VIEW COMPARISON:  February 17, 2020. FINDINGS: Stable cardiomegaly. Tracheostomy and feeding tubes are unchanged in position. No pneumothorax is noted. Right pleural effusion is significantly smaller. Stable bibasilar atelectasis is noted. Bony thorax is unremarkable. IMPRESSION: Right pleural effusion is significantly smaller. No pneumothorax is noted. Stable bibasilar atelectasis. Stable support apparatus. Aortic Atherosclerosis (ICD10-I70.0). Electronically Signed   By: Lupita RaiderJames  Green Jr M.D.   On: 02/19/2020 16:24   DG CHEST PORT 1 VIEW  Result Date: 02/17/2020 CLINICAL DATA:  Pneumonia EXAM: PORTABLE CHEST 1 VIEW COMPARISON:  February 14, 2020 FINDINGS: Tracheostomy catheter tip is 5.2 cm above the carina. Feeding tube tip is below the  diaphragm. No pneumothorax. There is a partially loculated right pleural effusion, grossly stable. There is slightly less interstitial thickening overall, likely due to partial but incomplete resolution of interstitial pulmonary edema. Heart is upper normal in size with pulmonary vascularity normal. No adenopathy. No bone lesions. IMPRESSION: Tube positions as described without pneumothorax. Sizable partially loculated right pleural effusion. There may well be superimposed atelectasis and/or consolidation on the right. There is a degree of underlying interstitial pulmonary edema, slightly less compared to recent study. No new opacity evident. Stable cardiac silhouette. Electronically Signed   By: Bretta BangWilliam  Woodruff III M.D.   On: 02/17/2020 08:17   DG CHEST PORT 1 VIEW  Result Date: 02/14/2020 CLINICAL DATA:  Respiratory failure.  Pneumonia.  On ventilator. EXAM: PORTABLE CHEST 1 VIEW COMPARISON:  Chest x-rays dated 02/12/2020 and 02/10/2020. FINDINGS: Now in near complete opacification the RIGHT hemithorax. Diffuse interstitial opacities throughout the LEFT lung are not significantly changed, compatible with pulmonary edema versus atypical pneumonia. Enteric tube passes below the diaphragm. Tracheostomy tube appears appropriately positioned in the midline. No pneumothorax is seen. IMPRESSION: 1. Worsening opacification of the RIGHT hemithorax, now near complete opacification. This could represent worsening pneumonia, airspace collapse and/or pleural effusion. 2. Stable diffuse interstitial opacities throughout the LEFT lung, compatible with pulmonary edema versus atypical pneumonia. Electronically Signed   By: Bary RichardStan  Maynard M.D.   On: 02/14/2020 13:40   DG CHEST PORT 1 VIEW  Result Date: 02/12/2020 CLINICAL DATA:  Pneumonia EXAM: PORTABLE CHEST 1 VIEW COMPARISON:  02/10/2020 FINDINGS: Tracheostomy and nasoenteric feeding tube extending into the upper abdomen beyond the margin of the examination are unchanged.  Pulmonary insufflation is stable. Small right pleural effusion is again seen. Progressive  left lower lobe collapse with resultant retrocardiac opacification noted. There is progressive diffuse pulmonary infiltrate, infection versus edema. IMPRESSION: Progressive left lower lobe collapse. Progressive diffuse pulmonary infiltrate, infection versus edema. Stable small right pleural effusion. Electronically Signed   By: Helyn Numbers MD   On: 02/12/2020 06:24   DG Abd Portable 1V  Result Date: 02/10/2020 CLINICAL DATA:  Enteric catheter placement EXAM: PORTABLE ABDOMEN - 1 VIEW COMPARISON:  None. FINDINGS: Supine frontal view of the abdomen and pelvis excludes the hemidiaphragms by collimation. Enteric catheter coiled over the the distal stomach, tip in the region of the gastric antrum or proximal duodenum. Bowel gas pattern is unremarkable without obstruction or ileus. There are no masses or abnormal calcifications. IMPRESSION: 1. Enteric catheter tip projecting over the gastric antrum or duodenal bulb. 2. Unremarkable bowel gas pattern. Electronically Signed   By: Sharlet Salina M.D.   On: 02/10/2020 01:31   ECHOCARDIOGRAM COMPLETE  Result Date: 02/11/2020    ECHOCARDIOGRAM REPORT   Patient Name:   SHYLYN YOUNCE Splitt Date of Exam: 02/11/2020 Medical Rec #:  [id number]        Height: Accession #:    [id number]       Weight: Date of Birth:  September 22, 1940        BSA: Patient Age:    79 years         BP:           129/70 mmHg Patient Gender: F                HR:           69 bpm. Exam Location:  Inpatient Procedure: 2D Echo, Cardiac Doppler and Color Doppler Indications:     CHF, atrial fibrillation  History:         Patient has no prior history of Echocardiogram examinations.                  CHF, Arrythmias:Atrial Fibrillation, Signs/Symptoms:Resp.                  failure; Risk Factors:Diabetes, Hypertension and Dyslipidemia.                  Type A aortic dissection repair, Trach collar.  Sonographer:     Lavenia Atlas RDCS Referring Phys:  [id number] Orpah Cobb Diagnosing Phys: Orpah Cobb MD IMPRESSIONS  1. Left ventricular ejection fraction, by estimation, is 60 to 65%. The left ventricle has normal function. The left ventricle has no regional wall motion abnormalities. There is mild concentric left ventricular hypertrophy. Left ventricular diastolic parameters are indeterminate.  2. Right ventricular systolic function is low normal. The right ventricular size is normal. There is mildly elevated pulmonary artery systolic pressure.  3. Left atrial size was moderately dilated.  4. Right atrial size was mildly dilated.  5. The mitral valve is degenerative. Mild mitral valve regurgitation.  6. Tricuspid valve regurgitation is moderate.  7. The aortic valve is tricuspid. There is mild calcification of the aortic valve. There is mild thickening of the aortic valve. Aortic valve regurgitation is mild.  8. The inferior vena cava is dilated in size with <50% respiratory variability, suggesting right atrial pressure of 15 mmHg. FINDINGS  Left Ventricle: Left ventricular ejection fraction, by estimation, is 60 to 65%. The left ventricle has normal function. The left ventricle has no regional wall motion abnormalities. The left ventricular internal cavity size was normal in size. There is  mild concentric left ventricular  hypertrophy. Left ventricular diastolic parameters are indeterminate. Right Ventricle: The right ventricular size is normal. No increase in right ventricular wall thickness. Right ventricular systolic function is low normal. There is mildly elevated pulmonary artery systolic pressure. The tricuspid regurgitant velocity is 3.11 m/s, and with an assumed right atrial pressure of 3 mmHg, the estimated right ventricular systolic pressure is 41.7 mmHg. Left Atrium: Left atrial size was moderately dilated. Right Atrium: Right atrial size was mildly dilated. Pericardium: There is no evidence of pericardial effusion.  Mitral Valve: The mitral valve is degenerative in appearance. There is mild thickening of the mitral valve leaflet(s). There is mild calcification of the mitral valve leaflet(s). Mild mitral annular calcification. Mild mitral valve regurgitation. Tricuspid Valve: The tricuspid valve is normal in structure. Tricuspid valve regurgitation is moderate. Aortic Valve: The aortic valve is tricuspid. There is mild calcification of the aortic valve. There is mild thickening of the aortic valve. There is mild aortic valve annular calcification. Aortic valve regurgitation is mild. Aortic regurgitation PHT measures 309 msec. Pulmonic Valve: The pulmonic valve was normal in structure. Pulmonic valve regurgitation is mild. Aorta: The aortic root is normal in size and structure. There is minimal (Grade I) atheroma plaque involving the aortic root and ascending aorta. Venous: The inferior vena cava is dilated in size with less than 50% respiratory variability, suggesting right atrial pressure of 15 mmHg. IAS/Shunts: The interatrial septum was not assessed.  LEFT VENTRICLE PLAX 2D LVIDd:         4.10 cm  Diastology LVIDs:         2.60 cm  LV e' medial:    5.11 cm/s LV PW:         1.20 cm  LV E/e' medial:  21.9 LV IVS:        1.30 cm  LV e' lateral:   6.85 cm/s LVOT diam:     2.30 cm  LV E/e' lateral: 16.4 LV SV:         138 LVOT Area:     4.15 cm  RIGHT VENTRICLE RV Basal diam:  3.20 cm RV S prime:     9.03 cm/s TAPSE (M-mode): 2.6 cm LEFT ATRIUM             RIGHT ATRIUM LA diam:        4.40 cm RA Area:     16.30 cm LA Vol (A2C):   52.1 ml RA Volume:   41.00 ml LA Vol (A4C):   83.9 ml LA Biplane Vol: 69.6 ml  AORTIC VALVE LVOT Vmax:   135.00 cm/s LVOT Vmean:  91.700 cm/s LVOT VTI:    0.331 m AI PHT:      309 msec  AORTA Ao Root diam: 3.00 cm MITRAL VALVE                TRICUSPID VALVE MV Area (PHT): 4.68 cm     TR Peak grad:   38.7 mmHg MV Decel Time: 162 msec     TR Vmax:        311.00 cm/s MV E velocity: 112.00 cm/s MV A  velocity: 38.30 cm/s   SHUNTS MV E/A ratio:  2.92         Systemic VTI:  0.33 m                             Systemic Diam: 2.30 cm Orpah Cobb MD Electronically signed by Orpah Cobb MD Signature  Date/Time: 02/11/2020/4:28:14 PM    Final    IR THORACENTESIS ASP PLEURAL SPACE W/IMG GUIDE  Result Date: 02/19/2020 INDICATION: Shortness of breath. Respiratory failure. Right-sided pleural effusion. Request for diagnostic and therapeutic thoracentesis. EXAM: ULTRASOUND GUIDED RIGHT THORACENTESIS MEDICATIONS: 1% plain lidocaine, 5 mL COMPLICATIONS: None immediate. PROCEDURE: An ultrasound guided thoracentesis was thoroughly discussed with the patient and questions answered. The benefits, risks, alternatives and complications were also discussed. The patient understands and wishes to proceed with the procedure. Written consent was obtained. Ultrasound was performed to localize and mark an adequate pocket of fluid in the right chest. The area was then prepped and draped in the normal sterile fashion. 1% Lidocaine was used for local anesthesia. Under ultrasound guidance a 6 Fr Safe-T-Centesis catheter was introduced. Thoracentesis was performed. The catheter was removed and a dressing applied. FINDINGS: A total of approximately 1.6 L of serosanguineous pleural fluid was removed. Samples were sent to the laboratory as requested by the clinical team. IMPRESSION: Successful ultrasound guided right thoracentesis yielding 1.6 L of pleural fluid. Read by: Brayton El PA-C Electronically Signed   By: Malachy Moan M.D.   On: 02/19/2020 16:25    Labs:  CBC: Recent Labs    02/19/20 0650 02/20/20 0424 02/22/20 0349 02/24/20 0807  WBC 8.8 8.1 7.8 8.3  HGB 7.1* 8.3* 8.0* 8.7*  HCT 24.9* 26.3* 27.3* 29.1*  PLT 224 258 266 299    COAGS: No results for input(s): INR, APTT in the last 8760 hours.  BMP: Recent Labs    02/17/20 0415 02/18/20 0407 02/19/20 0650 02/22/20 0349 02/24/20 0807 02/25/20 0620   NA 142  --  140 138 138  --   K 3.2*   < > 3.7 3.7 3.2* 3.5  CL 97*  --  99 97* 97*  --   CO2 35*  --  31 33* 32  --   GLUCOSE 141*  --  142* 129* 122*  --   BUN 39*  --  39* 34* 25*  --   CALCIUM 8.0*  --  7.9* 7.9* 7.8*  --   CREATININE 0.68  --  0.66 0.66 0.61  --   GFRNONAA >60  --  >60 >60 >60  --    < > = values in this interval not displayed.    LIVER FUNCTION TESTS: Recent Labs    02/11/20 0342  BILITOT 1.1  AST 27  ALT 26  ALKPHOS 81  PROT 6.6  ALBUMIN 2.6*    TUMOR MARKERS: No results for input(s): AFPTM, CEA, CA199, CHROMGRNA in the last 8760 hours.  Assessment and Plan:  Malnutrition Dysphagia  Deconditioning Need for percutaneous gastric tube placement Scheduled in IR 02/26/20 Risks and benefits image guided gastrostomy tube placement was discussed with the patient and Dtr Jeanna via phone including, but not limited to the need for a barium enema during the procedure, bleeding, infection, peritonitis and/or damage to adjacent structures.  All questions were answered, Charisse March is agreeable to proceed. Consent signed and in chart.   Thank you for this interesting consult.  I greatly enjoyed meeting Tanga Gloor Esters and look forward to participating in their care.  A copy of this report was sent to the requesting provider on this date.  Electronically Signed: Robet Leu, PA-C 02/25/2020, 2:43 PM   I spent a total of 40 Minutes    in face to face in clinical consultation, greater than 50% of which was counseling/coordinating care for percutaneous gastric tube placement

## 2020-02-26 ENCOUNTER — Other Ambulatory Visit (HOSPITAL_COMMUNITY): Payer: Medicare Other

## 2020-02-26 DIAGNOSIS — I482 Chronic atrial fibrillation, unspecified: Secondary | ICD-10-CM | POA: Diagnosis not present

## 2020-02-26 DIAGNOSIS — J9621 Acute and chronic respiratory failure with hypoxia: Secondary | ICD-10-CM | POA: Diagnosis not present

## 2020-02-26 DIAGNOSIS — I7101 Dissection of thoracic aorta: Secondary | ICD-10-CM | POA: Diagnosis not present

## 2020-02-26 DIAGNOSIS — I5032 Chronic diastolic (congestive) heart failure: Secondary | ICD-10-CM | POA: Diagnosis not present

## 2020-02-26 HISTORY — PX: IR GASTROSTOMY TUBE MOD SED: IMG625

## 2020-02-26 LAB — PROTIME-INR
INR: 1.3 — ABNORMAL HIGH (ref 0.8–1.2)
Prothrombin Time: 15.5 seconds — ABNORMAL HIGH (ref 11.4–15.2)

## 2020-02-26 MED ORDER — LIDOCAINE HCL 1 % IJ SOLN
INTRAMUSCULAR | Status: AC
  Filled 2020-02-26: qty 20

## 2020-02-26 MED ORDER — MIDAZOLAM HCL 2 MG/2ML IJ SOLN
INTRAMUSCULAR | Status: AC | PRN
  Administered 2020-02-26: 1 mg via INTRAVENOUS

## 2020-02-26 MED ORDER — FENTANYL CITRATE (PF) 100 MCG/2ML IJ SOLN
INTRAMUSCULAR | Status: AC
  Filled 2020-02-26: qty 2

## 2020-02-26 MED ORDER — CEFAZOLIN SODIUM-DEXTROSE 2-4 GM/100ML-% IV SOLN: 2.0000 g | Freq: Once | INTRAVENOUS | Status: AC

## 2020-02-26 MED ORDER — MIDAZOLAM HCL 2 MG/2ML IJ SOLN
INTRAMUSCULAR | Status: AC
  Filled 2020-02-26: qty 2

## 2020-02-26 MED ORDER — CEFAZOLIN SODIUM-DEXTROSE 2-4 GM/100ML-% IV SOLN
INTRAVENOUS | Status: AC
  Filled 2020-02-26: qty 100

## 2020-02-26 MED ORDER — IOHEXOL 300 MG/ML  SOLN
50.0000 mL | Freq: Once | INTRAMUSCULAR | Status: AC | PRN
  Administered 2020-02-26: 10 mL

## 2020-02-26 MED ORDER — LIDOCAINE HCL (PF) 1 % IJ SOLN
INTRAMUSCULAR | Status: AC | PRN
  Administered 2020-02-26: 10 mL

## 2020-02-26 MED ORDER — FENTANYL CITRATE (PF) 100 MCG/2ML IJ SOLN
INTRAMUSCULAR | Status: AC | PRN
  Administered 2020-02-26: 25 ug via INTRAVENOUS

## 2020-02-26 NOTE — Procedures (Signed)
Interventional Radiology Procedure Note  Procedure: Placement of percutaneous 20F pull-through gastrostomy tube. Complications: None Recommendations: - NPO except for sips and chips remainder of today and overnight - Maintain G-tube to LWS until tomorrow morning  - May advance diet as tolerated and begin using tube tomorrow morning  Signed,   Ermel Verne S. Numan Zylstra, DO   

## 2020-02-26 NOTE — Progress Notes (Signed)
Pulmonary Critical Care Medicine [facility] [place]   PULMONARY CRITICAL CARE SERVICE  PROGRESS NOTE  Date of Service: 02/26/2020  Kristine Herrera  ZOX:[id number]  DOB: 06-24-1940   DOA: 02/09/2020  Referring Physician: Carron Curie, MD  HPI: Kristine Herrera is a 80 y.o. female seen for follow up of Acute on Chronic Respiratory Failure.  Patient is went down for a PEG placement the patient right now is on full support on the ventilator postoperatively  Medications: Reviewed on Rounds  Physical Exam:  Vitals: Temperature 97.3 pulse 77 respiratory rate 23 blood pressure is 136/66 saturations 99%  Ventilator Settings on full support at this time   General: Comfortable at this time  Eyes: Grossly normal lids, irises & conjunctiva  ENT: grossly tongue is normal  Neck: no obvious mass  Cardiovascular: S1 S2 normal no gallop  Respiratory: Scattered rhonchi expansion is equal  Abdomen: soft  Skin: no rash seen on limited exam  Musculoskeletal: not rigid  Psychiatric:unable to assess  Neurologic: no seizure no involuntary movements         Lab Data:   Basic Metabolic Panel: Recent Labs  Lab 02/22/20 0349 02/24/20 0807 02/25/20 0620  NA 138 138  --   K 3.7 3.2* 3.5  CL 97* 97*  --   CO2 33* 32  --   GLUCOSE 129* 122*  --   BUN 34* 25*  --   CREATININE 0.66 0.61  --   CALCIUM 7.9* 7.8*  --   MG 2.3 2.2  --   PHOS  --  3.0  --     ABG: No results for input(s): PHART, PCO2ART, PO2ART, HCO3, O2SAT in the last 168 hours.  Liver Function Tests: No results for input(s): AST, ALT, ALKPHOS, BILITOT, PROT, ALBUMIN in the last 168 hours. No results for input(s): LIPASE, AMYLASE in the last 168 hours. No results for input(s): AMMONIA in the last 168 hours.  CBC: Recent Labs  Lab 02/20/20 0424 02/22/20 0349 02/24/20 0807  WBC 8.1 7.8 8.3  HGB 8.3* 8.0* 8.7*  HCT 26.3* 27.3* 29.1*  MCV 88.9 90.4 89.5  PLT 258 266 299    Cardiac Enzymes: No  results for input(s): CKTOTAL, CKMB, CKMBINDEX, TROPONINI in the last 168 hours.  BNP (last 3 results) Recent Labs    02/11/20 0342  BNP 254.8*    ProBNP (last 3 results) No results for input(s): PROBNP in the last 8760 hours.  Radiological Exams: IR GASTROSTOMY TUBE MOD SED  Result Date: 02/26/2020 INDICATION: 80 year old female with a history EXAM: PERC PLACEMENT GASTROSTOMY MEDICATIONS: 2 g Ancef; Antibiotics were administered within 1 hour of the procedure. ANESTHESIA/SEDATION: Versed 1.0 mg IV; Fentanyl 25 mcg IV Moderate Sedation Time:  10 minutes The patient was continuously monitored during the procedure by the interventional radiology nurse under my direct supervision. CONTRAST:  66mL OMNIPAQUE IOHEXOL 300 MG/ML SOLN - administered into the gastric lumen. FLUOROSCOPY TIME:  Fluoroscopy Time: 1 minutes 48 seconds (11 mGy). COMPLICATIONS: None PROCEDURE: Informed written consent was obtained from the patient and the patient's family after a thorough discussion of the procedural risks, benefits and alternatives. All questions were addressed. Maximal Sterile Barrier Technique was utilized including caps, mask, sterile gowns, sterile gloves, sterile drape, hand hygiene and skin antiseptic. A timeout was performed prior to the initiation of the procedure. The epigastrium was prepped with Betadine in a sterile fashion, and a sterile drape was applied covering the operative field. A sterile gown and sterile gloves  were used for the procedure. A 5-French orogastric tube is placed under fluoroscopic guidance. Scout imaging of the abdomen confirms barium within the transverse colon. The stomach was distended with gas. Under fluoroscopic guidance, an 18 gauge needle was utilized to puncture the anterior wall of the body of the stomach. An Amplatz wire was advanced through the needle passing a T fastener into the lumen of the stomach. The T fastener was secured for gastropexy. A 9-French sheath was  inserted. A snare was advanced through the 9-French sheath. A Teena Dunk was advanced through the orogastric tube. It was snared then pulled out the oral cavity, pulling the snare, as well. The leading edge of the gastrostomy was attached to the snare. It was then pulled down the esophagus and out the percutaneous site. Tube secured in place. Contrast was injected. Patient tolerated the procedure well and remained hemodynamically stable throughout. No complications were encountered and no significant blood loss encountered. IMPRESSION: Status post fluoroscopic placed percutaneous gastrostomy tube, with 20 [place] pull-through. Signed, Yvone Neu. Loreta Ave, DO [facility] Radiology Electronically Signed   By: Gilmer Mor D.O.   On: 02/26/2020 10:47    Assessment/Plan Active Problems:   Acute on chronic respiratory failure with hypoxia (HCC)   Chronic atrial fibrillation (HCC)   Pneumothorax   Chronic heart failure with preserved ejection fraction (HCC)   Ascending aortic dissection (HCC)   1. Acute on chronic respiratory failure with hypoxia plan is to hold off on weaning today because the patient just had a PEG placed reassess again tomorrow and try to wean on T collar 2. Chronic atrial fibrillation rate is controlled 3. Pneumothorax supportive care 4. Chronic heart failure preserved ejection fraction at baseline 5. Ascending aortic dissection status post repair   I have personally seen and evaluated the patient, evaluated laboratory and imaging results, formulated the assessment and plan and placed orders. The Patient requires high complexity decision making with multiple systems involvement.  Rounds were done with the Respiratory Therapy Director and Staff therapists and discussed with nursing staff also.  Yevonne Pax, MD [facility] Pulmonary Critical Care Medicine Sleep Medicine

## 2020-02-27 ENCOUNTER — Other Ambulatory Visit (HOSPITAL_COMMUNITY): Payer: Medicare Other

## 2020-02-27 DIAGNOSIS — I7101 Dissection of thoracic aorta: Secondary | ICD-10-CM | POA: Diagnosis not present

## 2020-02-27 DIAGNOSIS — I5032 Chronic diastolic (congestive) heart failure: Secondary | ICD-10-CM | POA: Diagnosis not present

## 2020-02-27 DIAGNOSIS — I482 Chronic atrial fibrillation, unspecified: Secondary | ICD-10-CM | POA: Diagnosis not present

## 2020-02-27 DIAGNOSIS — J9621 Acute and chronic respiratory failure with hypoxia: Secondary | ICD-10-CM | POA: Diagnosis not present

## 2020-02-27 LAB — CBC
HCT: 36.4 % (ref 36.0–46.0)
Hemoglobin: 11 g/dL — ABNORMAL LOW (ref 12.0–15.0)
MCH: 30.7 pg (ref 26.0–34.0)
MCHC: 30.2 g/dL (ref 30.0–36.0)
MCV: 101.7 fL — ABNORMAL HIGH (ref 80.0–100.0)
Platelets: 208 10*3/uL (ref 150–400)
RBC: 3.58 MIL/uL — ABNORMAL LOW (ref 3.87–5.11)
RDW: 16.8 % — ABNORMAL HIGH (ref 11.5–15.5)
WBC: 6.6 10*3/uL (ref 4.0–10.5)
nRBC: 0 % (ref 0.0–0.2)

## 2020-02-27 LAB — PHOSPHORUS: Phosphorus: 4.8 mg/dL — ABNORMAL HIGH (ref 2.5–4.6)

## 2020-02-27 LAB — BASIC METABOLIC PANEL
Anion gap: 9 (ref 5–15)
BUN: 35 mg/dL — ABNORMAL HIGH (ref 8–23)
CO2: 31 mmol/L (ref 22–32)
Calcium: 8.3 mg/dL — ABNORMAL LOW (ref 8.9–10.3)
Chloride: 100 mmol/L (ref 98–111)
Creatinine, Ser: 0.84 mg/dL (ref 0.44–1.00)
GFR, Estimated: >60 mL/min (ref >60)
Glucose, Bld: 98 mg/dL (ref 70–99)
Potassium: 3.9 mmol/L (ref 3.5–5.1)
Sodium: 140 mmol/L (ref 135–145)

## 2020-02-27 LAB — MAGNESIUM: Magnesium: 2.1 mg/dL (ref 1.7–2.4)

## 2020-02-27 NOTE — Progress Notes (Signed)
Pulmonary Critical Care Medicine [facility] [place]   PULMONARY CRITICAL CARE SERVICE  PROGRESS NOTE  Date of Service: 02/27/2020  Kristine Herrera  VHQ:[id number]  DOB: January 10, 1941   DOA: 02/09/2020  Referring Physician: Carron Curie, MD  HPI: Kristine Herrera is a 80 y.o. female seen for follow up of Acute on Chronic Respiratory Failure.  Patient currently is on T collar has been on 28% FiO2 with a goal of 16-hour  Medications: Reviewed on Rounds  Physical Exam:  Vitals: Temperature is 98.1 pulse 64 respiratory 15 blood pressure is 114/51 saturations 97%  Ventilator Settings on T collar with an FiO2 28%  . General: Comfortable at this time . Eyes: Grossly normal lids, irises & conjunctiva . ENT: grossly tongue is normal . Neck: no obvious mass . Cardiovascular: S1 S2 normal no gallop . Respiratory: Scattered rhonchi expansion is equal . Abdomen: soft . Skin: no rash seen on limited exam . Musculoskeletal: not rigid . Psychiatric:unable to assess . Neurologic: no seizure no involuntary movements         Lab Data:   Basic Metabolic Panel: Recent Labs  Lab 02/22/20 0349 02/24/20 0807 02/25/20 0620 02/27/20 0531  NA 138 138  --  140  K 3.7 3.2* 3.5 3.9  CL 97* 97*  --  100  CO2 33* 32  --  31  GLUCOSE 129* 122*  --  98  BUN 34* 25*  --  35*  CREATININE 0.66 0.61  --  0.84  CALCIUM 7.9* 7.8*  --  8.3*  MG 2.3 2.2  --  2.1  PHOS  --  3.0  --  4.8*    ABG: No results for input(s): PHART, PCO2ART, PO2ART, HCO3, O2SAT in the last 168 hours.  Liver Function Tests: No results for input(s): AST, ALT, ALKPHOS, BILITOT, PROT, ALBUMIN in the last 168 hours. No results for input(s): LIPASE, AMYLASE in the last 168 hours. No results for input(s): AMMONIA in the last 168 hours.  CBC: Recent Labs  Lab 02/22/20 0349 02/24/20 0807 02/27/20 0531  WBC 7.8 8.3 6.6  HGB 8.0* 8.7* 11.0*  HCT 27.3* 29.1* 36.4  MCV 90.4 89.5 101.7*  PLT 266 299 208     Cardiac Enzymes: No results for input(s): CKTOTAL, CKMB, CKMBINDEX, TROPONINI in the last 168 hours.  BNP (last 3 results) Recent Labs    02/11/20 0342  BNP 254.8*    ProBNP (last 3 results) No results for input(s): PROBNP in the last 8760 hours.  Radiological Exams: IR GASTROSTOMY TUBE MOD SED  Result Date: 02/26/2020 INDICATION: 80 year old female with a history EXAM: PERC PLACEMENT GASTROSTOMY MEDICATIONS: 2 g Ancef; Antibiotics were administered within 1 hour of the procedure. ANESTHESIA/SEDATION: Versed 1.0 mg IV; Fentanyl 25 mcg IV Moderate Sedation Time:  10 minutes The patient was continuously monitored during the procedure by the interventional radiology nurse under my direct supervision. CONTRAST:  64mL OMNIPAQUE IOHEXOL 300 MG/ML SOLN - administered into the gastric lumen. FLUOROSCOPY TIME:  Fluoroscopy Time: 1 minutes 48 seconds (11 mGy). COMPLICATIONS: None PROCEDURE: Informed written consent was obtained from the patient and the patient's family after a thorough discussion of the procedural risks, benefits and alternatives. All questions were addressed. Maximal Sterile Barrier Technique was utilized including caps, mask, sterile gowns, sterile gloves, sterile drape, hand hygiene and skin antiseptic. A timeout was performed prior to the initiation of the procedure. The epigastrium was prepped with Betadine in a sterile fashion, and a sterile drape was applied covering  the operative field. A sterile gown and sterile gloves were used for the procedure. A 5-French orogastric tube is placed under fluoroscopic guidance. Scout imaging of the abdomen confirms barium within the transverse colon. The stomach was distended with gas. Under fluoroscopic guidance, an 18 gauge needle was utilized to puncture the anterior wall of the body of the stomach. An Amplatz wire was advanced through the needle passing a T fastener into the lumen of the stomach. The T fastener was secured for gastropexy. A  9-French sheath was inserted. A snare was advanced through the 9-French sheath. A Teena Dunk was advanced through the orogastric tube. It was snared then pulled out the oral cavity, pulling the snare, as well. The leading edge of the gastrostomy was attached to the snare. It was then pulled down the esophagus and out the percutaneous site. Tube secured in place. Contrast was injected. Patient tolerated the procedure well and remained hemodynamically stable throughout. No complications were encountered and no significant blood loss encountered. IMPRESSION: Status post fluoroscopic placed percutaneous gastrostomy tube, with 20 [place] pull-through. Signed, Yvone Neu. Loreta Ave, DO [facility] Radiology Electronically Signed   By: Gilmer Mor D.O.   On: 02/26/2020 10:47   DG Chest Port 1 View  Result Date: 02/27/2020 CLINICAL DATA:  Ventilator dependent. EXAM: PORTABLE CHEST 1 VIEW COMPARISON:  02/24/2020 FINDINGS: Right more than left pleural effusion, at least moderate on the right. Chronic cardiomegaly with left atrial clipping. There is a tracheostomy tube place. Vascular congestion has likely improved from before. IMPRESSION: 1. Unchanged right more than left pleural effusion and presumed atelectasis. 2. Possible improvement in pulmonary edema/vascular congestion. Electronically Signed   By: Marnee Spring M.D.   On: 02/27/2020 07:48    Assessment/Plan Active Problems:   Acute on chronic respiratory failure with hypoxia (HCC)   Chronic atrial fibrillation (HCC)   Pneumothorax   Chronic heart failure with preserved ejection fraction (HCC)   Ascending aortic dissection (HCC)   1. Acute on chronic respiratory failure with hypoxia plan is to continue with T collar trials goal of 16 hours today 2. Chronic atrial fibrillation rate is controlled 3. Pneumothorax resolved 4. Chronic heart failure preserved ejection fraction supportive care 5. Aortic dissection  or no change   I have personally seen and evaluated the patient, evaluated laboratory and imaging results, formulated the assessment and plan and placed orders. The Patient requires high complexity decision making with multiple systems involvement.  Rounds were done with the Respiratory Therapy Director and Staff therapists and discussed with nursing staff also.  Yevonne Pax, MD [facility] Pulmonary Critical Care Medicine Sleep Medicine

## 2020-02-27 NOTE — Progress Notes (Cosign Needed)
   RN reports via phone:  G tube site is clean and dry NT no bleeding afeb +BS Pt is alert and up in bed  May use G tube now

## 2020-02-28 ENCOUNTER — Other Ambulatory Visit (HOSPITAL_COMMUNITY): Payer: Medicare Other

## 2020-02-28 DIAGNOSIS — J9621 Acute and chronic respiratory failure with hypoxia: Secondary | ICD-10-CM | POA: Diagnosis not present

## 2020-02-28 DIAGNOSIS — I5032 Chronic diastolic (congestive) heart failure: Secondary | ICD-10-CM | POA: Diagnosis not present

## 2020-02-28 DIAGNOSIS — I7101 Dissection of thoracic aorta: Secondary | ICD-10-CM | POA: Diagnosis not present

## 2020-02-28 DIAGNOSIS — I482 Chronic atrial fibrillation, unspecified: Secondary | ICD-10-CM | POA: Diagnosis not present

## 2020-02-28 NOTE — Progress Notes (Signed)
Pulmonary Critical Care Medicine [facility] [place]   PULMONARY CRITICAL CARE SERVICE  PROGRESS NOTE  Date of Service: 02/28/2020  Kristine Herrera  WUJ:[id number]  DOB: Jun 11, 1940   DOA: 02/09/2020  Referring Physician: Carron Curie, MD  HPI: Kristine Herrera is a 80 y.o. female seen for follow up of Acute on Chronic Respiratory Failure.  Patient is on T collar on 28% FiO2 goal today is for about 20 hours but working to try to see if she can go as tolerated  Medications: Reviewed on Rounds  Physical Exam:  Vitals: Temperature 98.4 pulse 65 respiratory rate 16 blood pressure is 111/57 saturations 97%  Ventilator Settings on T collar with an FiO2 of 28%  . General: Comfortable at this time . Eyes: Grossly normal lids, irises & conjunctiva . ENT: grossly tongue is normal . Neck: no obvious mass . Cardiovascular: S1 S2 normal no gallop . Respiratory: Scattered rhonchi very coarse breath sounds . Abdomen: soft . Skin: no rash seen on limited exam . Musculoskeletal: not rigid . Psychiatric:unable to assess . Neurologic: no seizure no involuntary movements         Lab Data:   Basic Metabolic Panel: Recent Labs  Lab 02/22/20 0349 02/24/20 0807 02/25/20 0620 02/27/20 0531  NA 138 138  --  140  K 3.7 3.2* 3.5 3.9  CL 97* 97*  --  100  CO2 33* 32  --  31  GLUCOSE 129* 122*  --  98  BUN 34* 25*  --  35*  CREATININE 0.66 0.61  --  0.84  CALCIUM 7.9* 7.8*  --  8.3*  MG 2.3 2.2  --  2.1  PHOS  --  3.0  --  4.8*    ABG: No results for input(s): PHART, PCO2ART, PO2ART, HCO3, O2SAT in the last 168 hours.  Liver Function Tests: No results for input(s): AST, ALT, ALKPHOS, BILITOT, PROT, ALBUMIN in the last 168 hours. No results for input(s): LIPASE, AMYLASE in the last 168 hours. No results for input(s): AMMONIA in the last 168 hours.  CBC: Recent Labs  Lab 02/22/20 0349 02/24/20 0807 02/27/20 0531  WBC 7.8 8.3 6.6  HGB 8.0* 8.7* 11.0*  HCT 27.3* 29.1*  36.4  MCV 90.4 89.5 101.7*  PLT 266 299 208    Cardiac Enzymes: No results for input(s): CKTOTAL, CKMB, CKMBINDEX, TROPONINI in the last 168 hours.  BNP (last 3 results) Recent Labs    02/11/20 0342  BNP 254.8*    ProBNP (last 3 results) No results for input(s): PROBNP in the last 8760 hours.  Radiological Exams: IR GASTROSTOMY TUBE MOD SED  Result Date: 02/26/2020 INDICATION: 80 year old female with a history EXAM: PERC PLACEMENT GASTROSTOMY MEDICATIONS: 2 g Ancef; Antibiotics were administered within 1 hour of the procedure. ANESTHESIA/SEDATION: Versed 1.0 mg IV; Fentanyl 25 mcg IV Moderate Sedation Time:  10 minutes The patient was continuously monitored during the procedure by the interventional radiology nurse under my direct supervision. CONTRAST:  72mL OMNIPAQUE IOHEXOL 300 MG/ML SOLN - administered into the gastric lumen. FLUOROSCOPY TIME:  Fluoroscopy Time: 1 minutes 48 seconds (11 mGy). COMPLICATIONS: None PROCEDURE: Informed written consent was obtained from the patient and the patient's family after a thorough discussion of the procedural risks, benefits and alternatives. All questions were addressed. Maximal Sterile Barrier Technique was utilized including caps, mask, sterile gowns, sterile gloves, sterile drape, hand hygiene and skin antiseptic. A timeout was performed prior to the initiation of the procedure. The epigastrium was prepped  with Betadine in a sterile fashion, and a sterile drape was applied covering the operative field. A sterile gown and sterile gloves were used for the procedure. A 5-French orogastric tube is placed under fluoroscopic guidance. Scout imaging of the abdomen confirms barium within the transverse colon. The stomach was distended with gas. Under fluoroscopic guidance, an 18 gauge needle was utilized to puncture the anterior wall of the body of the stomach. An Amplatz wire was advanced through the needle passing a T fastener into the lumen of the  stomach. The T fastener was secured for gastropexy. A 9-French sheath was inserted. A snare was advanced through the 9-French sheath. A Teena Dunk was advanced through the orogastric tube. It was snared then pulled out the oral cavity, pulling the snare, as well. The leading edge of the gastrostomy was attached to the snare. It was then pulled down the esophagus and out the percutaneous site. Tube secured in place. Contrast was injected. Patient tolerated the procedure well and remained hemodynamically stable throughout. No complications were encountered and no significant blood loss encountered. IMPRESSION: Status post fluoroscopic placed percutaneous gastrostomy tube, with 20 [place] pull-through. Signed, Kristine Neu. Loreta Ave, DO [facility] Radiology Electronically Signed   By: Kristine Herrera D.O.   On: 02/26/2020 10:47   DG Chest Port 1 View  Result Date: 02/27/2020 CLINICAL DATA:  Ventilator dependent. EXAM: PORTABLE CHEST 1 VIEW COMPARISON:  02/24/2020 FINDINGS: Right more than left pleural effusion, at least moderate on the right. Chronic cardiomegaly with left atrial clipping. There is a tracheostomy tube place. Vascular congestion has likely improved from before. IMPRESSION: 1. Unchanged right more than left pleural effusion and presumed atelectasis. 2. Possible improvement in pulmonary edema/vascular congestion. Electronically Signed   By: Marnee Spring M.D.   On: 02/27/2020 07:48    Assessment/Plan Active Problems:   Acute on chronic respiratory failure with hypoxia (HCC)   Chronic atrial fibrillation (HCC)   Pneumothorax   Chronic heart failure with preserved ejection fraction (HCC)   Ascending aortic dissection (HCC)   1. Acute on chronic respiratory failure hypoxia we will continue with 28% T-piece the goal is for 24 hours now as tolerated instructed respiratory therapy to leave her off as long as she is doing well. 2. Chronic atrial fibrillation  rate is controlled 3. Pneumothorax resolved patient still has some pleural effusions noted. 4. Chronic heart failure preserved ejection fraction being followed by cardiology 5. Ascending aortic dissection status postrepair   I have personally seen and evaluated the patient, evaluated laboratory and imaging results, formulated the assessment and plan and placed orders. The Patient requires high complexity decision making with multiple systems involvement.  Rounds were done with the Respiratory Therapy Director and Staff therapists and discussed with nursing staff also.  Yevonne Pax, MD [facility] Pulmonary Critical Care Medicine Sleep Medicine

## 2020-02-29 ENCOUNTER — Other Ambulatory Visit (HOSPITAL_COMMUNITY): Payer: Medicare Other

## 2020-02-29 DIAGNOSIS — I5032 Chronic diastolic (congestive) heart failure: Secondary | ICD-10-CM | POA: Diagnosis not present

## 2020-02-29 DIAGNOSIS — I7101 Dissection of thoracic aorta: Secondary | ICD-10-CM | POA: Diagnosis not present

## 2020-02-29 DIAGNOSIS — J9621 Acute and chronic respiratory failure with hypoxia: Secondary | ICD-10-CM | POA: Diagnosis not present

## 2020-02-29 DIAGNOSIS — I482 Chronic atrial fibrillation, unspecified: Secondary | ICD-10-CM | POA: Diagnosis not present

## 2020-02-29 LAB — BLOOD GAS, ARTERIAL
Acid-Base Excess: 10.7 mmol/L — ABNORMAL HIGH (ref 0.0–2.0)
Bicarbonate: 34.7 mmol/L — ABNORMAL HIGH (ref 20.0–28.0)
FIO2: 28
O2 Saturation: 97.1 %
Patient temperature: 36.9
pCO2 arterial: 45.8 mmHg (ref 32.0–48.0)
pH, Arterial: 7.491 — ABNORMAL HIGH (ref 7.350–7.450)
pO2, Arterial: 79.5 mmHg — ABNORMAL LOW (ref 83.0–108.0)

## 2020-02-29 LAB — PHOSPHORUS: Phosphorus: 3 mg/dL (ref 2.5–4.6)

## 2020-02-29 LAB — BASIC METABOLIC PANEL
Anion gap: 11 (ref 5–15)
BUN: 12 mg/dL (ref 8–23)
CO2: 32 mmol/L (ref 22–32)
Calcium: 8.2 mg/dL — ABNORMAL LOW (ref 8.9–10.3)
Chloride: 94 mmol/L — ABNORMAL LOW (ref 98–111)
Creatinine, Ser: 0.53 mg/dL (ref 0.44–1.00)
GFR, Estimated: >60 mL/min (ref >60)
Glucose, Bld: 147 mg/dL — ABNORMAL HIGH (ref 70–99)
Potassium: 3.3 mmol/L — ABNORMAL LOW (ref 3.5–5.1)
Sodium: 137 mmol/L (ref 135–145)

## 2020-02-29 LAB — CBC
HCT: 32.6 % — ABNORMAL LOW (ref 36.0–46.0)
Hemoglobin: 9.9 g/dL — ABNORMAL LOW (ref 12.0–15.0)
MCH: 27 pg (ref 26.0–34.0)
MCHC: 30.4 g/dL (ref 30.0–36.0)
MCV: 88.8 fL (ref 80.0–100.0)
Platelets: 413 10*3/uL — ABNORMAL HIGH (ref 150–400)
RBC: 3.67 MIL/uL — ABNORMAL LOW (ref 3.87–5.11)
RDW: 17.4 % — ABNORMAL HIGH (ref 11.5–15.5)
WBC: 8.1 10*3/uL (ref 4.0–10.5)
nRBC: 0 % (ref 0.0–0.2)

## 2020-02-29 LAB — MAGNESIUM: Magnesium: 2.1 mg/dL (ref 1.7–2.4)

## 2020-02-29 NOTE — Progress Notes (Signed)
Pulmonary Critical Care Medicine [facility] [place]   PULMONARY CRITICAL CARE SERVICE  PROGRESS NOTE  Date of Service: 02/29/2020  Kristine Herrera  ZOX:[id number]  DOB: 1940/09/01   DOA: 02/09/2020  Referring Physician: Carron Curie, MD  HPI: Kristine Herrera is a 80 y.o. female seen for follow up of Acute on Chronic Respiratory Failure.  Patient is on T collar currently on 28% FiO2 goal is 24 hours  Medications: Reviewed on Rounds  Physical Exam:  Vitals: Temperature 98.0 pulse 80 respiratory rate is 21 blood pressure is 129/79 saturations 100%  Ventilator Settings on T collar off the ventilator on 28% FiO2  . General: Comfortable at this time . Eyes: Grossly normal lids, irises & conjunctiva . ENT: grossly tongue is normal . Neck: no obvious mass . Cardiovascular: S1 S2 normal no gallop . Respiratory: No rhonchi coarse breath sounds . Abdomen: soft . Skin: no rash seen on limited exam . Musculoskeletal: not rigid . Psychiatric:unable to assess . Neurologic: no seizure no involuntary movements         Lab Data:   Basic Metabolic Panel: Recent Labs  Lab 02/24/20 0807 02/25/20 0620 02/27/20 0531  NA 138  --  140  K 3.2* 3.5 3.9  CL 97*  --  100  CO2 32  --  31  GLUCOSE 122*  --  98  BUN 25*  --  35*  CREATININE 0.61  --  0.84  CALCIUM 7.8*  --  8.3*  MG 2.2  --  2.1  PHOS 3.0  --  4.8*    ABG: No results for input(s): PHART, PCO2ART, PO2ART, HCO3, O2SAT in the last 168 hours.  Liver Function Tests: No results for input(s): AST, ALT, ALKPHOS, BILITOT, PROT, ALBUMIN in the last 168 hours. No results for input(s): LIPASE, AMYLASE in the last 168 hours. No results for input(s): AMMONIA in the last 168 hours.  CBC: Recent Labs  Lab 02/24/20 0807 02/27/20 0531  WBC 8.3 6.6  HGB 8.7* 11.0*  HCT 29.1* 36.4  MCV 89.5 101.7*  PLT 299 208    Cardiac Enzymes: No results for input(s): CKTOTAL, CKMB, CKMBINDEX, TROPONINI in the last 168  hours.  BNP (last 3 results) Recent Labs    02/11/20 0342  BNP 254.8*    ProBNP (last 3 results) No results for input(s): PROBNP in the last 8760 hours.  Radiological Exams: DG Chest Port 1 View  Result Date: 02/29/2020 CLINICAL DATA:  Pneumonia and pleural effusion EXAM: PORTABLE CHEST 1 VIEW COMPARISON:  Two days ago FINDINGS: Chronic cardiomegaly. Left atrial clipping. Tracheostomy tube in place. Moderate or large right pleural effusion and probable small left pleural effusion. Lower lobe opacification. IMPRESSION: 1. Right more than left pleural effusion, moderate to large on the right. 2. Lower lobe atelectasis or pneumonia. 3. No definite change from 2 days ago. Electronically Signed   By: Marnee Spring M.D.   On: 02/29/2020 05:54    Assessment/Plan Active Problems:   Acute on chronic respiratory failure with hypoxia (HCC)   Chronic atrial fibrillation (HCC)   Pneumothorax   Chronic heart failure with preserved ejection fraction (HCC)   Ascending aortic dissection (HCC)   1. Acute on chronic respiratory failure hypoxia plan is to continue with the T collar try for 24 hours today. 2. Chronic atrial fibrillation rate is controlled 3. Pneumothorax treated resolved 4. Chronic heart failure preserved ejection fraction we will continue with supportive care monitor fluid status cardiology continues to follow 5.  Ascending aortic aneurysm dissection no change with supportive care   I have personally seen and evaluated the patient, evaluated laboratory and imaging results, formulated the assessment and plan and placed orders. The Patient requires high complexity decision making with multiple systems involvement.  Rounds were done with the Respiratory Therapy Director and Staff therapists and discussed with nursing staff also.  Yevonne Pax, MD [facility] Pulmonary Critical Care Medicine Sleep Medicine

## 2020-03-01 DIAGNOSIS — I5032 Chronic diastolic (congestive) heart failure: Secondary | ICD-10-CM | POA: Diagnosis not present

## 2020-03-01 DIAGNOSIS — I482 Chronic atrial fibrillation, unspecified: Secondary | ICD-10-CM | POA: Diagnosis not present

## 2020-03-01 DIAGNOSIS — I7101 Dissection of thoracic aorta: Secondary | ICD-10-CM | POA: Diagnosis not present

## 2020-03-01 DIAGNOSIS — J9621 Acute and chronic respiratory failure with hypoxia: Secondary | ICD-10-CM | POA: Diagnosis not present

## 2020-03-01 LAB — CBC
HCT: 30.9 % — ABNORMAL LOW (ref 36.0–46.0)
Hemoglobin: 9.5 g/dL — ABNORMAL LOW (ref 12.0–15.0)
MCH: 27.1 pg (ref 26.0–34.0)
MCHC: 30.7 g/dL (ref 30.0–36.0)
MCV: 88 fL (ref 80.0–100.0)
Platelets: 415 10*3/uL — ABNORMAL HIGH (ref 150–400)
RBC: 3.51 MIL/uL — ABNORMAL LOW (ref 3.87–5.11)
RDW: 17.7 % — ABNORMAL HIGH (ref 11.5–15.5)
WBC: 7.1 10*3/uL (ref 4.0–10.5)
nRBC: 0 % (ref 0.0–0.2)

## 2020-03-01 LAB — POTASSIUM: Potassium: 3.9 mmol/L (ref 3.5–5.1)

## 2020-03-01 NOTE — Progress Notes (Signed)
Pulmonary Critical Care Medicine [facility] [place]   PULMONARY CRITICAL CARE SERVICE  PROGRESS NOTE  Date of Service: 03/01/2020  Kristine Herrera  ZOX:[id number]  DOB: November 29, 1940   DOA: 02/09/2020  Referring Physician: Carron Curie, MD  HPI: Kristine Herrera is a 80 y.o. female seen for follow up of Acute on Chronic Respiratory Failure.  Patient right now is on T collar has been on 28% FiO2  Medications: Reviewed on Rounds  Physical Exam:  Vitals: Temperature is 97.2 pulse 84 respiratory 18 blood pressure is 148/76 saturations 98%  Ventilator Settings on T collar FiO2 28%  . General: Comfortable at this time . Eyes: Grossly normal lids, irises & conjunctiva . ENT: grossly tongue is normal . Neck: no obvious mass . Cardiovascular: S1 S2 normal no gallop . Respiratory: No rhonchi coarse breath sounds . Abdomen: soft . Skin: no rash seen on limited exam . Musculoskeletal: not rigid . Psychiatric:unable to assess . Neurologic: no seizure no involuntary movements         Lab Data:   Basic Metabolic Panel: Recent Labs  Lab 02/24/20 0807 02/25/20 0620 02/27/20 0531 02/29/20 0935 03/01/20 0454  NA 138  --  140 137  --   K 3.2* 3.5 3.9 3.3* 3.9  CL 97*  --  100 94*  --   CO2 32  --  31 32  --   GLUCOSE 122*  --  98 147*  --   BUN 25*  --  35* 12  --   CREATININE 0.61  --  0.84 0.53  --   CALCIUM 7.8*  --  8.3* 8.2*  --   MG 2.2  --  2.1 2.1  --   PHOS 3.0  --  4.8* 3.0  --     ABG: Recent Labs  Lab 02/29/20 0850  PHART 7.491*  PCO2ART 45.8  PO2ART 79.5*  HCO3 34.7*  O2SAT 97.1    Liver Function Tests: No results for input(s): AST, ALT, ALKPHOS, BILITOT, PROT, ALBUMIN in the last 168 hours. No results for input(s): LIPASE, AMYLASE in the last 168 hours. No results for input(s): AMMONIA in the last 168 hours.  CBC: Recent Labs  Lab 02/24/20 0807 02/27/20 0531 02/29/20 0935 03/01/20 0454  WBC 8.3 6.6 8.1 7.1  HGB 8.7* 11.0* 9.9* 9.5*   HCT 29.1* 36.4 32.6* 30.9*  MCV 89.5 101.7* 88.8 88.0  PLT 299 208 413* 415*    Cardiac Enzymes: No results for input(s): CKTOTAL, CKMB, CKMBINDEX, TROPONINI in the last 168 hours.  BNP (last 3 results) Recent Labs    02/11/20 0342  BNP 254.8*    ProBNP (last 3 results) No results for input(s): PROBNP in the last 8760 hours.  Radiological Exams: DG Chest Port 1 View  Result Date: 02/29/2020 CLINICAL DATA:  Pneumonia and pleural effusion EXAM: PORTABLE CHEST 1 VIEW COMPARISON:  Two days ago FINDINGS: Chronic cardiomegaly. Left atrial clipping. Tracheostomy tube in place. Moderate or large right pleural effusion and probable small left pleural effusion. Lower lobe opacification. IMPRESSION: 1. Right more than left pleural effusion, moderate to large on the right. 2. Lower lobe atelectasis or pneumonia. 3. No definite change from 2 days ago. Electronically Signed   By: Marnee Spring M.D.   On: 02/29/2020 05:54    Assessment/Plan Active Problems:   Acute on chronic respiratory failure with hypoxia (HCC)   Chronic atrial fibrillation (HCC)   Pneumothorax   Chronic heart failure with preserved ejection fraction (HCC)  Ascending aortic dissection (HCC)   1. Acute on chronic respiratory failure hypoxia patient is on T collar currently goal is for 48 hours. 2. Chronic atrial fibrillation rate is controlled 3. Heart failure preserved ejection fraction no change continue to monitor 4. Ascending aortic aneurysm no change 5. Pneumothorax resolved   I have personally seen and evaluated the patient, evaluated laboratory and imaging results, formulated the assessment and plan and placed orders. The Patient requires high complexity decision making with multiple systems involvement.  Rounds were done with the Respiratory Therapy Director and Staff therapists and discussed with nursing staff also.  Yevonne Pax, MD [facility] Pulmonary Critical Care Medicine Sleep Medicine

## 2020-03-03 ENCOUNTER — Other Ambulatory Visit (HOSPITAL_COMMUNITY): Payer: Medicare Other

## 2020-03-03 DIAGNOSIS — I7101 Dissection of thoracic aorta: Secondary | ICD-10-CM | POA: Diagnosis not present

## 2020-03-03 DIAGNOSIS — I482 Chronic atrial fibrillation, unspecified: Secondary | ICD-10-CM | POA: Diagnosis not present

## 2020-03-03 DIAGNOSIS — I5032 Chronic diastolic (congestive) heart failure: Secondary | ICD-10-CM | POA: Diagnosis not present

## 2020-03-03 DIAGNOSIS — J9621 Acute and chronic respiratory failure with hypoxia: Secondary | ICD-10-CM | POA: Diagnosis not present

## 2020-03-03 NOTE — Progress Notes (Signed)
Pulmonary Critical Care Medicine [facility] [place]   PULMONARY CRITICAL CARE SERVICE  PROGRESS NOTE  Date of Service: 03/03/2020  Kristine Herrera  GUY:[id number]  DOB: 07-Mar-1940   DOA: 02/09/2020  Referring Physician: Carron Curie, MD  HPI: Kristine Herrera is a 80 y.o. female seen for follow up of Acute on Chronic Respiratory Failure.  Patient is on T collar currently on 28% FiO2 using the PMV  Medications: Reviewed on Rounds  Physical Exam:  Vitals: Temperature is 97.0 pulse 92 respiratory 22 blood pressure is 124/74 saturations 99%  Ventilator Settings off the ventilator on T collar FiO2 28%  . General: Comfortable at this time . Eyes: Grossly normal lids, irises & conjunctiva . ENT: grossly tongue is normal . Neck: no obvious mass . Cardiovascular: S1 S2 normal no gallop . Respiratory: Coarse rhonchi expansion is equal . Abdomen: soft . Skin: no rash seen on limited exam . Musculoskeletal: not rigid . Psychiatric:unable to assess . Neurologic: no seizure no involuntary movements         Lab Data:   Basic Metabolic Panel: Recent Labs  Lab 02/27/20 0531 02/29/20 0935 03/01/20 0454  NA 140 137  --   K 3.9 3.3* 3.9  CL 100 94*  --   CO2 31 32  --   GLUCOSE 98 147*  --   BUN 35* 12  --   CREATININE 0.84 0.53  --   CALCIUM 8.3* 8.2*  --   MG 2.1 2.1  --   PHOS 4.8* 3.0  --     ABG: Recent Labs  Lab 02/29/20 0850  PHART 7.491*  PCO2ART 45.8  PO2ART 79.5*  HCO3 34.7*  O2SAT 97.1    Liver Function Tests: No results for input(s): AST, ALT, ALKPHOS, BILITOT, PROT, ALBUMIN in the last 168 hours. No results for input(s): LIPASE, AMYLASE in the last 168 hours. No results for input(s): AMMONIA in the last 168 hours.  CBC: Recent Labs  Lab 02/27/20 0531 02/29/20 0935 03/01/20 0454  WBC 6.6 8.1 7.1  HGB 11.0* 9.9* 9.5*  HCT 36.4 32.6* 30.9*  MCV 101.7* 88.8 88.0  PLT 208 413* 415*    Cardiac Enzymes: No results for input(s):  CKTOTAL, CKMB, CKMBINDEX, TROPONINI in the last 168 hours.  BNP (last 3 results) Recent Labs    02/11/20 0342  BNP 254.8*    ProBNP (last 3 results) No results for input(s): PROBNP in the last 8760 hours.  Radiological Exams: No results found.  Assessment/Plan Active Problems:   Acute on chronic respiratory failure with hypoxia (HCC)   Chronic atrial fibrillation (HCC)   Pneumothorax   Chronic heart failure with preserved ejection fraction (HCC)   Ascending aortic dissection (HCC)   1. Acute on chronic respiratory failure hypoxia we will continue with T-piece titrate oxygen continue pulmonary toilet. 2. Chronic atrial fibrillation rate is controlled at this time 3. Pneumothorax treated resolved 4. Chronic heart failure preserved ejection fraction no change 5. Ascending aortic dissection status post repair   I have personally seen and evaluated the patient, evaluated laboratory and imaging results, formulated the assessment and plan and placed orders. The Patient requires high complexity decision making with multiple systems involvement.  Rounds were done with the Respiratory Therapy Director and Staff therapists and discussed with nursing staff also.  Yevonne Pax, MD [facility] Pulmonary Critical Care Medicine Sleep Medicine

## 2020-03-04 DIAGNOSIS — I5032 Chronic diastolic (congestive) heart failure: Secondary | ICD-10-CM | POA: Diagnosis not present

## 2020-03-04 DIAGNOSIS — I7101 Dissection of thoracic aorta: Secondary | ICD-10-CM | POA: Diagnosis not present

## 2020-03-04 DIAGNOSIS — J9621 Acute and chronic respiratory failure with hypoxia: Secondary | ICD-10-CM | POA: Diagnosis not present

## 2020-03-04 DIAGNOSIS — I482 Chronic atrial fibrillation, unspecified: Secondary | ICD-10-CM | POA: Diagnosis not present

## 2020-03-04 NOTE — Progress Notes (Signed)
Pulmonary Critical Care Medicine [facility] [place]   PULMONARY CRITICAL CARE SERVICE  PROGRESS NOTE  Date of Service: 03/04/2020  Kristine Herrera  [id number]:[id number]  DOB: 25-Jun-1940   DOA: 02/09/2020  Referring Physician: Carron Curie, MD  HPI: Kristine Herrera is a 80 y.o. female seen for follow up of Acute on Chronic Respiratory Failure.  Patient is currently off the ventilator on T collar has been on 28% FiO2 ready for capping  Medications: Reviewed on Rounds  Physical Exam:  Vitals: Temperature is 97.6 pulse 100 respiratory 24 blood pressure is 129/68 saturations 94%  Ventilator Settings off the ventilator on T collar  . General: Comfortable at this time . Eyes: Grossly normal lids, irises & conjunctiva . ENT: grossly tongue is normal . Neck: no obvious mass . Cardiovascular: S1 S2 normal no gallop . Respiratory: No rhonchi no rales are noted at this time . Abdomen: soft . Skin: no rash seen on limited exam . Musculoskeletal: not rigid . Psychiatric:unable to assess . Neurologic: no seizure no involuntary movements         Lab Data:   Basic Metabolic Panel: Recent Labs  Lab 02/27/20 0531 02/29/20 0935 03/01/20 0454  NA 140 137  --   K 3.9 3.3* 3.9  CL 100 94*  --   CO2 31 32  --   GLUCOSE 98 147*  --   BUN 35* 12  --   CREATININE 0.84 0.53  --   CALCIUM 8.3* 8.2*  --   MG 2.1 2.1  --   PHOS 4.8* 3.0  --     ABG: Recent Labs  Lab 02/29/20 0850  PHART 7.491*  PCO2ART 45.8  PO2ART 79.5*  HCO3 34.7*  O2SAT 97.1    Liver Function Tests: No results for input(s): AST, ALT, ALKPHOS, BILITOT, PROT, ALBUMIN in the last 168 hours. No results for input(s): LIPASE, AMYLASE in the last 168 hours. No results for input(s): AMMONIA in the last 168 hours.  CBC: Recent Labs  Lab 02/27/20 0531 02/29/20 0935 03/01/20 0454  WBC 6.6 8.1 7.1  HGB 11.0* 9.9* 9.5*  HCT 36.4 32.6* 30.9*  MCV 101.7* 88.8 88.0  PLT 208 413* 415*    Cardiac  Enzymes: No results for input(s): CKTOTAL, CKMB, CKMBINDEX, TROPONINI in the last 168 hours.  BNP (last 3 results) Recent Labs    02/11/20 0342  BNP 254.8*    ProBNP (last 3 results) No results for input(s): PROBNP in the last 8760 hours.  Radiological Exams: No results found.  Assessment/Plan Active Problems:   Acute on chronic respiratory failure with hypoxia (HCC)   Chronic atrial fibrillation (HCC)   Pneumothorax   Chronic heart failure with preserved ejection fraction (HCC)   Ascending aortic dissection (HCC)   1. Acute on chronic respiratory failure hypoxia plan is going to be to advance the weaning and proceed to capping as tolerated 2. Chronic atrial fibrillation rate is controlled 3. Pneumothorax has been treated 4. Chronic heart failure preserved ejection fraction supportive care we will continue to monitor 5. Ascending aortic dissection no change   I have personally seen and evaluated the patient, evaluated laboratory and imaging results, formulated the assessment and plan and placed orders. The Patient requires high complexity decision making with multiple systems involvement.  Rounds were done with the Respiratory Therapy Director and Staff therapists and discussed with nursing staff also.  Yevonne Pax, MD [facility] Pulmonary Critical Care Medicine Sleep Medicine

## 2020-03-05 DIAGNOSIS — J9621 Acute and chronic respiratory failure with hypoxia: Secondary | ICD-10-CM | POA: Diagnosis not present

## 2020-03-05 DIAGNOSIS — I482 Chronic atrial fibrillation, unspecified: Secondary | ICD-10-CM | POA: Diagnosis not present

## 2020-03-05 DIAGNOSIS — I7101 Dissection of thoracic aorta: Secondary | ICD-10-CM | POA: Diagnosis not present

## 2020-03-05 DIAGNOSIS — I5032 Chronic diastolic (congestive) heart failure: Secondary | ICD-10-CM | POA: Diagnosis not present

## 2020-03-05 LAB — CBC
HCT: 31.1 % — ABNORMAL LOW (ref 36.0–46.0)
Hemoglobin: 9.4 g/dL — ABNORMAL LOW (ref 12.0–15.0)
MCH: 26.9 pg (ref 26.0–34.0)
MCHC: 30.2 g/dL (ref 30.0–36.0)
MCV: 89.1 fL (ref 80.0–100.0)
Platelets: 382 10*3/uL (ref 150–400)
RBC: 3.49 MIL/uL — ABNORMAL LOW (ref 3.87–5.11)
RDW: 18 % — ABNORMAL HIGH (ref 11.5–15.5)
WBC: 6.9 10*3/uL (ref 4.0–10.5)
nRBC: 0 % (ref 0.0–0.2)

## 2020-03-05 LAB — BASIC METABOLIC PANEL
Anion gap: 7 (ref 5–15)
BUN: 25 mg/dL — ABNORMAL HIGH (ref 8–23)
CO2: 34 mmol/L — ABNORMAL HIGH (ref 22–32)
Calcium: 8.5 mg/dL — ABNORMAL LOW (ref 8.9–10.3)
Chloride: 94 mmol/L — ABNORMAL LOW (ref 98–111)
Creatinine, Ser: 0.7 mg/dL (ref 0.44–1.00)
GFR, Estimated: >60 mL/min (ref >60)
Glucose, Bld: 129 mg/dL — ABNORMAL HIGH (ref 70–99)
Potassium: 3.8 mmol/L (ref 3.5–5.1)
Sodium: 135 mmol/L (ref 135–145)

## 2020-03-05 LAB — MAGNESIUM: Magnesium: 2.1 mg/dL (ref 1.7–2.4)

## 2020-03-05 NOTE — Progress Notes (Signed)
Pulmonary Critical Care Medicine [facility] [place]   PULMONARY CRITICAL CARE SERVICE  PROGRESS NOTE  Date of Service: 03/05/2020  FRANCISCO EYERLY  GYI:[id number]  DOB: 18-Jun-1940   DOA: 02/09/2020  Referring Physician: Carron Curie, MD  HPI: Kristine Herrera is a 80 y.o. female seen for follow up of Acute on Chronic Respiratory Failure.  Patient is capping right now on 1 L has been doing capping trials for 24-hour  Medications: Reviewed on Rounds  Physical Exam:  Vitals: Temperature is 97.1 pulse 102 respiratory 24 blood pressure 154/78 saturations 100%  Ventilator Settings on capping trials  . General: Comfortable at this time . Eyes: Grossly normal lids, irises & conjunctiva . ENT: grossly tongue is normal . Neck: no obvious mass . Cardiovascular: S1 S2 normal no gallop . Respiratory: No rhonchi no rales noted at this time . Abdomen: soft . Skin: no rash seen on limited exam . Musculoskeletal: not rigid . Psychiatric:unable to assess . Neurologic: no seizure no involuntary movements         Lab Data:   Basic Metabolic Panel: Recent Labs  Lab 02/29/20 0935 03/01/20 0454 03/05/20 0637  NA 137  --  135  K 3.3* 3.9 3.8  CL 94*  --  94*  CO2 32  --  34*  GLUCOSE 147*  --  129*  BUN 12  --  25*  CREATININE 0.53  --  0.70  CALCIUM 8.2*  --  8.5*  MG 2.1  --  2.1  PHOS 3.0  --   --     ABG: Recent Labs  Lab 02/29/20 0850  PHART 7.491*  PCO2ART 45.8  PO2ART 79.5*  HCO3 34.7*  O2SAT 97.1    Liver Function Tests: No results for input(s): AST, ALT, ALKPHOS, BILITOT, PROT, ALBUMIN in the last 168 hours. No results for input(s): LIPASE, AMYLASE in the last 168 hours. No results for input(s): AMMONIA in the last 168 hours.  CBC: Recent Labs  Lab 02/29/20 0935 03/01/20 0454 03/05/20 0637  WBC 8.1 7.1 6.9  HGB 9.9* 9.5* 9.4*  HCT 32.6* 30.9* 31.1*  MCV 88.8 88.0 89.1  PLT 413* 415* 382    Cardiac Enzymes: No results for input(s):  CKTOTAL, CKMB, CKMBINDEX, TROPONINI in the last 168 hours.  BNP (last 3 results) Recent Labs    02/11/20 0342  BNP 254.8*    ProBNP (last 3 results) No results for input(s): PROBNP in the last 8760 hours.  Radiological Exams: No results found.  Assessment/Plan Active Problems:   Acute on chronic respiratory failure with hypoxia (HCC)   Chronic atrial fibrillation (HCC)   Pneumothorax   Chronic heart failure with preserved ejection fraction (HCC)   Ascending aortic dissection (HCC)   1. Acute on chronic respiratory failure with hypoxia we will continue with capping as ordered.  Continue with secretion management supportive care. 2. Chronic atrial fibrillation rate is controlled 3. Pneumothorax resolved 4. Chronic heart failure preserved ejection fraction compensated 5. Ascending aortic dissection status post repair   I have personally seen and evaluated the patient, evaluated laboratory and imaging results, formulated the assessment and plan and placed orders. The Patient requires high complexity decision making with multiple systems involvement.  Rounds were done with the Respiratory Therapy Director and Staff therapists and discussed with nursing staff also.  Yevonne Pax, MD [facility] Pulmonary Critical Care Medicine Sleep Medicine

## 2020-03-06 DIAGNOSIS — J9621 Acute and chronic respiratory failure with hypoxia: Secondary | ICD-10-CM | POA: Diagnosis not present

## 2020-03-06 DIAGNOSIS — I482 Chronic atrial fibrillation, unspecified: Secondary | ICD-10-CM | POA: Diagnosis not present

## 2020-03-06 DIAGNOSIS — I7101 Dissection of thoracic aorta: Secondary | ICD-10-CM | POA: Diagnosis not present

## 2020-03-06 DIAGNOSIS — I5032 Chronic diastolic (congestive) heart failure: Secondary | ICD-10-CM | POA: Diagnosis not present

## 2020-03-06 NOTE — Progress Notes (Signed)
Pulmonary Critical Care Medicine [facility] [place]   PULMONARY CRITICAL CARE SERVICE  PROGRESS NOTE  Date of Service: 03/06/2020  Kristine Herrera  IWL:[id number]  DOB: Feb 13, 1940   DOA: 02/09/2020  Referring Physician: Carron Curie, MD  HPI: Kristine Herrera is a 80 y.o. female seen for follow up of Acute on Chronic Respiratory Failure.  Patient currently is capping has been on 1 L of oxygen today's goal is 48-hour  Medications: Reviewed on Rounds  Physical Exam:  Vitals: Temperature is 97.6 pulse 60 respiratory rate is 21 blood pressure is 128/60 saturations 99  Ventilator Settings capping on 1 L of oxygen  . General: Comfortable at this time . Eyes: Grossly normal lids, irises & conjunctiva . ENT: grossly tongue is normal . Neck: no obvious mass . Cardiovascular: S1 S2 normal no gallop . Respiratory: Scattered rhonchi expansion is equal . Abdomen: soft . Skin: no rash seen on limited exam . Musculoskeletal: not rigid . Psychiatric:unable to assess . Neurologic: no seizure no involuntary movements         Lab Data:   Basic Metabolic Panel: Recent Labs  Lab 02/29/20 0935 03/01/20 0454 03/05/20 0637  NA 137  --  135  K 3.3* 3.9 3.8  CL 94*  --  94*  CO2 32  --  34*  GLUCOSE 147*  --  129*  BUN 12  --  25*  CREATININE 0.53  --  0.70  CALCIUM 8.2*  --  8.5*  MG 2.1  --  2.1  PHOS 3.0  --   --     ABG: Recent Labs  Lab 02/29/20 0850  PHART 7.491*  PCO2ART 45.8  PO2ART 79.5*  HCO3 34.7*  O2SAT 97.1    Liver Function Tests: No results for input(s): AST, ALT, ALKPHOS, BILITOT, PROT, ALBUMIN in the last 168 hours. No results for input(s): LIPASE, AMYLASE in the last 168 hours. No results for input(s): AMMONIA in the last 168 hours.  CBC: Recent Labs  Lab 02/29/20 0935 03/01/20 0454 03/05/20 0637  WBC 8.1 7.1 6.9  HGB 9.9* 9.5* 9.4*  HCT 32.6* 30.9* 31.1*  MCV 88.8 88.0 89.1  PLT 413* 415* 382    Cardiac Enzymes: No results for  input(s): CKTOTAL, CKMB, CKMBINDEX, TROPONINI in the last 168 hours.  BNP (last 3 results) Recent Labs    02/11/20 0342  BNP 254.8*    ProBNP (last 3 results) No results for input(s): PROBNP in the last 8760 hours.  Radiological Exams: No results found.  Assessment/Plan Active Problems:   Acute on chronic respiratory failure with hypoxia (HCC)   Chronic atrial fibrillation (HCC)   Pneumothorax   Chronic heart failure with preserved ejection fraction (HCC)   Ascending aortic dissection (HCC)   1. Acute on chronic respiratory failure hypoxia plan is to continue with capping trials patient is on 1 L of O2 good saturations are noted 2. Chronic atrial fibrillation rate is controlled 3. Chronic heart failure.  Ejection fraction continue with supportive care 4. Ascending aortic dissection no change 5. Pneumothorax resolved   I have personally seen and evaluated the patient, evaluated laboratory and imaging results, formulated the assessment and plan and placed orders. The Patient requires high complexity decision making with multiple systems involvement.  Rounds were done with the Respiratory Therapy Director and Staff therapists and discussed with nursing staff also.  Yevonne Pax, MD [facility] Pulmonary Critical Care Medicine Sleep Medicine

## 2020-03-07 DIAGNOSIS — I5032 Chronic diastolic (congestive) heart failure: Secondary | ICD-10-CM | POA: Diagnosis not present

## 2020-03-07 DIAGNOSIS — I7101 Dissection of thoracic aorta: Secondary | ICD-10-CM | POA: Diagnosis not present

## 2020-03-07 DIAGNOSIS — J9621 Acute and chronic respiratory failure with hypoxia: Secondary | ICD-10-CM | POA: Diagnosis not present

## 2020-03-07 DIAGNOSIS — I482 Chronic atrial fibrillation, unspecified: Secondary | ICD-10-CM | POA: Diagnosis not present

## 2020-03-07 LAB — CBC
HCT: 28.3 % — ABNORMAL LOW (ref 36.0–46.0)
Hemoglobin: 9 g/dL — ABNORMAL LOW (ref 12.0–15.0)
MCH: 28 pg (ref 26.0–34.0)
MCHC: 31.8 g/dL (ref 30.0–36.0)
MCV: 88.2 fL (ref 80.0–100.0)
Platelets: 316 10*3/uL (ref 150–400)
RBC: 3.21 MIL/uL — ABNORMAL LOW (ref 3.87–5.11)
RDW: 17.6 % — ABNORMAL HIGH (ref 11.5–15.5)
WBC: 6.4 10*3/uL (ref 4.0–10.5)
nRBC: 0 % (ref 0.0–0.2)

## 2020-03-07 LAB — BASIC METABOLIC PANEL
Anion gap: 10 (ref 5–15)
BUN: 17 mg/dL (ref 8–23)
CO2: 32 mmol/L (ref 22–32)
Calcium: 8.7 mg/dL — ABNORMAL LOW (ref 8.9–10.3)
Chloride: 93 mmol/L — ABNORMAL LOW (ref 98–111)
Creatinine, Ser: 0.64 mg/dL (ref 0.44–1.00)
GFR, Estimated: >60 mL/min (ref >60)
Glucose, Bld: 96 mg/dL (ref 70–99)
Potassium: 3.4 mmol/L — ABNORMAL LOW (ref 3.5–5.1)
Sodium: 135 mmol/L (ref 135–145)

## 2020-03-07 LAB — MAGNESIUM: Magnesium: 2 mg/dL (ref 1.7–2.4)

## 2020-03-07 NOTE — Progress Notes (Signed)
Pulmonary Critical Care Medicine [facility] [place]   PULMONARY CRITICAL CARE SERVICE  PROGRESS NOTE  Date of Service: 03/07/2020  Kristine Herrera  TOI:[id number]  DOB: 1940-10-03   DOA: 02/09/2020  Referring Physician: Carron Curie, MD  HPI: Kristine Herrera is a 80 y.o. female seen for follow up of Acute on Chronic Respiratory Failure.  Patient is capping today 72 hours doing very well  Medications: Reviewed on Rounds  Physical Exam:  Vitals: Temperature is 97.7 pulse 68 respiratory 30 blood pressure is 135/61 saturations 99%  Ventilator Settings capping off the ventilator  . General: Comfortable at this time . Eyes: Grossly normal lids, irises & conjunctiva . ENT: grossly tongue is normal . Neck: no obvious mass . Cardiovascular: S1 S2 normal no gallop . Respiratory: No rhonchi very coarse breath sounds . Abdomen: soft . Skin: no rash seen on limited exam . Musculoskeletal: not rigid . Psychiatric:unable to assess . Neurologic: no seizure no involuntary movements         Lab Data:   Basic Metabolic Panel: Recent Labs  Lab 03/01/20 0454 03/05/20 0637 03/07/20 0458  NA  --  135 135  K 3.9 3.8 3.4*  CL  --  94* 93*  CO2  --  34* 32  GLUCOSE  --  129* 96  BUN  --  25* 17  CREATININE  --  0.70 0.64  CALCIUM  --  8.5* 8.7*  MG  --  2.1 2.0    ABG: No results for input(s): PHART, PCO2ART, PO2ART, HCO3, O2SAT in the last 168 hours.  Liver Function Tests: No results for input(s): AST, ALT, ALKPHOS, BILITOT, PROT, ALBUMIN in the last 168 hours. No results for input(s): LIPASE, AMYLASE in the last 168 hours. No results for input(s): AMMONIA in the last 168 hours.  CBC: Recent Labs  Lab 03/01/20 0454 03/05/20 0637 03/07/20 0458  WBC 7.1 6.9 6.4  HGB 9.5* 9.4* 9.0*  HCT 30.9* 31.1* 28.3*  MCV 88.0 89.1 88.2  PLT 415* 382 316    Cardiac Enzymes: No results for input(s): CKTOTAL, CKMB, CKMBINDEX, TROPONINI in the last 168 hours.  BNP (last  3 results) Recent Labs    02/11/20 0342  BNP 254.8*    ProBNP (last 3 results) No results for input(s): PROBNP in the last 8760 hours.  Radiological Exams: No results found.  Assessment/Plan Active Problems:   Acute on chronic respiratory failure with hypoxia (HCC)   Chronic atrial fibrillation (HCC)   Pneumothorax   Chronic heart failure with preserved ejection fraction (HCC)   Ascending aortic dissection (HCC)   1. Acute on chronic respiratory failure hypoxia we will continue with capping trials titrate oxygen continue pulmonary toilet 2. Chronic atrial fibrillation rate is controlled we will continue to follow 3. Pneumothorax at baseline 4. Chronic heart failure preserved ejection fraction 5. Ascending aortic  dissection no change   I have personally seen and evaluated the patient, evaluated laboratory and imaging results, formulated the assessment and plan and placed orders. The Patient requires high complexity decision making with multiple systems involvement.  Rounds were done with the Respiratory Therapy Director and Staff therapists and discussed with nursing staff also.  Yevonne Pax, MD [facility] Pulmonary Critical Care Medicine Sleep Medicine

## 2020-03-08 DIAGNOSIS — J9621 Acute and chronic respiratory failure with hypoxia: Secondary | ICD-10-CM | POA: Diagnosis not present

## 2020-03-08 DIAGNOSIS — I7101 Dissection of thoracic aorta: Secondary | ICD-10-CM | POA: Diagnosis not present

## 2020-03-08 DIAGNOSIS — I5032 Chronic diastolic (congestive) heart failure: Secondary | ICD-10-CM | POA: Diagnosis not present

## 2020-03-08 DIAGNOSIS — I482 Chronic atrial fibrillation, unspecified: Secondary | ICD-10-CM | POA: Diagnosis not present

## 2020-03-08 LAB — CBC
HCT: 28.1 % — ABNORMAL LOW (ref 36.0–46.0)
Hemoglobin: 8.9 g/dL — ABNORMAL LOW (ref 12.0–15.0)
MCH: 28.2 pg (ref 26.0–34.0)
MCHC: 31.7 g/dL (ref 30.0–36.0)
MCV: 88.9 fL (ref 80.0–100.0)
Platelets: 298 10*3/uL (ref 150–400)
RBC: 3.16 MIL/uL — ABNORMAL LOW (ref 3.87–5.11)
RDW: 17.8 % — ABNORMAL HIGH (ref 11.5–15.5)
WBC: 6.9 10*3/uL (ref 4.0–10.5)
nRBC: 0 % (ref 0.0–0.2)

## 2020-03-08 LAB — POTASSIUM: Potassium: 3.8 mmol/L (ref 3.5–5.1)

## 2020-03-08 LAB — SARS CORONAVIRUS 2 (TAT 6-24 HRS): SARS Coronavirus 2: NEGATIVE

## 2020-03-08 NOTE — Progress Notes (Signed)
Pulmonary Critical Care Medicine [facility] [place]   PULMONARY CRITICAL CARE SERVICE  PROGRESS NOTE  Date of Service: 03/08/2020  Kristine Herrera  YCX:[id number]  DOB: February 21, 1940   DOA: 02/09/2020  Referring Physician: Carron Curie, MD  HPI: Kristine Herrera is a 80 y.o. female seen for follow up of Acute on Chronic Respiratory Failure.  She is doing great with capping trials ready for decannulation  Medications: Reviewed on Rounds  Physical Exam:  Vitals: Temperature 97.4 pulse 65 respiratory 20 blood pressure is 112/63 saturations 100%  Ventilator Settings capping  . General: Comfortable at this time . Eyes: Grossly normal lids, irises & conjunctiva . ENT: grossly tongue is normal . Neck: no obvious mass . Cardiovascular: S1 S2 normal no gallop . Respiratory: No rhonchi very coarse breath sound . Abdomen: soft . Skin: no rash seen on limited exam . Musculoskeletal: not rigid . Psychiatric:unable to assess . Neurologic: no seizure no involuntary movements         Lab Data:   Basic Metabolic Panel: Recent Labs  Lab 03/05/20 0637 03/07/20 0458 03/08/20 0418  NA 135 135  --   K 3.8 3.4* 3.8  CL 94* 93*  --   CO2 34* 32  --   GLUCOSE 129* 96  --   BUN 25* 17  --   CREATININE 0.70 0.64  --   CALCIUM 8.5* 8.7*  --   MG 2.1 2.0  --     ABG: No results for input(s): PHART, PCO2ART, PO2ART, HCO3, O2SAT in the last 168 hours.  Liver Function Tests: No results for input(s): AST, ALT, ALKPHOS, BILITOT, PROT, ALBUMIN in the last 168 hours. No results for input(s): LIPASE, AMYLASE in the last 168 hours. No results for input(s): AMMONIA in the last 168 hours.  CBC: Recent Labs  Lab 03/05/20 0637 03/07/20 0458 03/08/20 0418  WBC 6.9 6.4 6.9  HGB 9.4* 9.0* 8.9*  HCT 31.1* 28.3* 28.1*  MCV 89.1 88.2 88.9  PLT 382 316 298    Cardiac Enzymes: No results for input(s): CKTOTAL, CKMB, CKMBINDEX, TROPONINI in the last 168 hours.  BNP (last 3  results) Recent Labs    02/11/20 0342  BNP 254.8*    ProBNP (last 3 results) No results for input(s): PROBNP in the last 8760 hours.  Radiological Exams: No results found.  Assessment/Plan Active Problems:   Acute on chronic respiratory failure with hypoxia (HCC)   Chronic atrial fibrillation (HCC)   Pneumothorax   Chronic heart failure with preserved ejection fraction (HCC)   Ascending aortic dissection (HCC)   1. Acute on chronic respiratory failure hypoxia we will continue with capping trials titrate oxygen continue pulmonary toilet. 2. Chronic atrial fibrillation rate is controlled 3. Pneumothorax resolved 4. Chronic heart failure preserved ejection fraction continue to follow along 5. Aortic dissection no change   I have personally seen and evaluated the patient, evaluated laboratory and imaging results, formulated the assessment and plan and placed orders. The Patient requires high complexity decision making with multiple systems involvement.  Rounds were done with the Respiratory Therapy Director and Staff therapists and discussed with nursing staff also.  Yevonne Pax, MD [facility] Pulmonary Critical Care Medicine Sleep Medicine

## 2021-06-20 IMAGING — CT CT CHEST W/O CM
2 of 4 series · 13 of 36 positions shown, 16 images · non-contrast
Comparison: Radiographs most recently 02/17/2020

CLINICAL DATA: Respiratory failure, pleural effusion repair of type
A aortic dissection 01/12/2020

EXAM:
CT CHEST WITHOUT CONTRAST
TECHNIQUE: Multidetector CT imaging of the chest was performed following the
standard protocol without IV contrast.

[Series 4: thorax 2.0 · axial · 0.87mm/px · z∈[+1036,+1340]mm · 10 of 170 slices shown, 13 images]
[im 9/170  mediastinal]
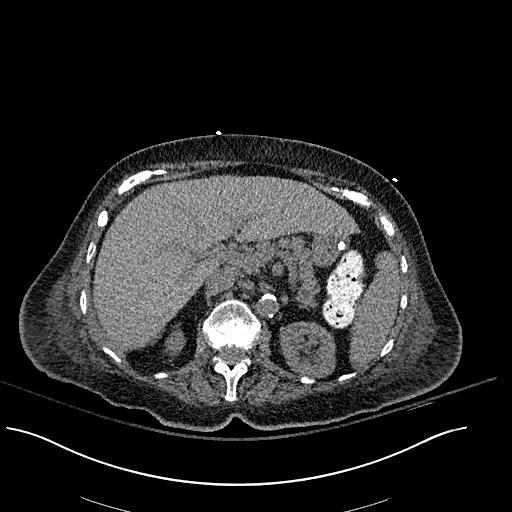
[im 9/170  lung]
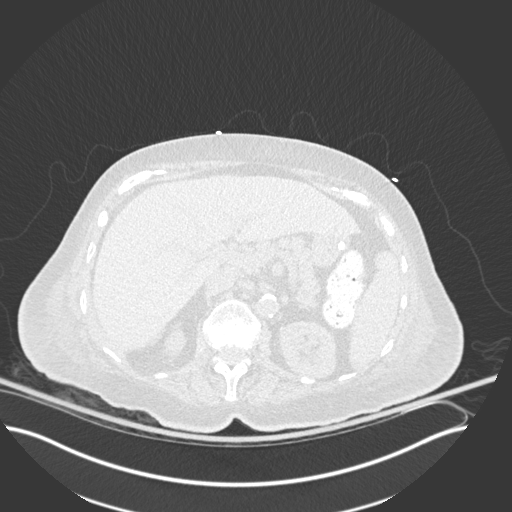
[im 27/170  lung]
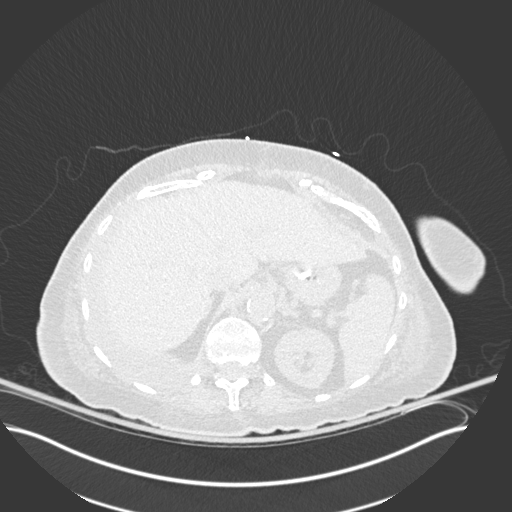
[im 45/170  lung]
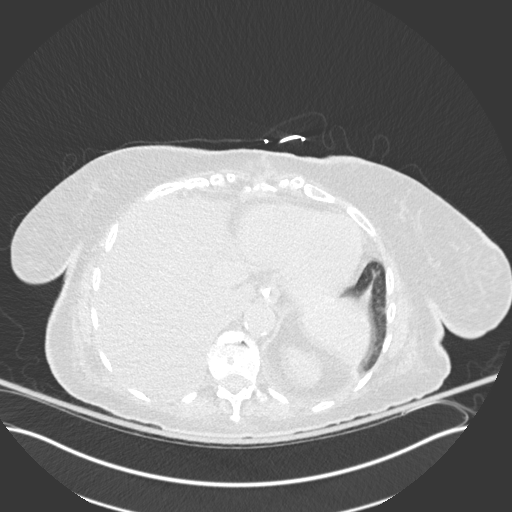
[im 63/170  lung]
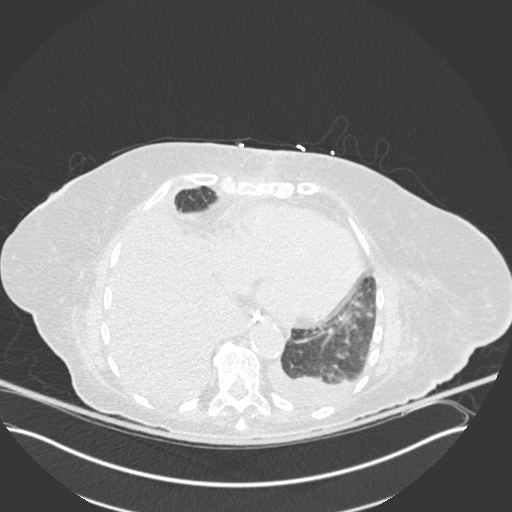
[im 81/170  mediastinal]
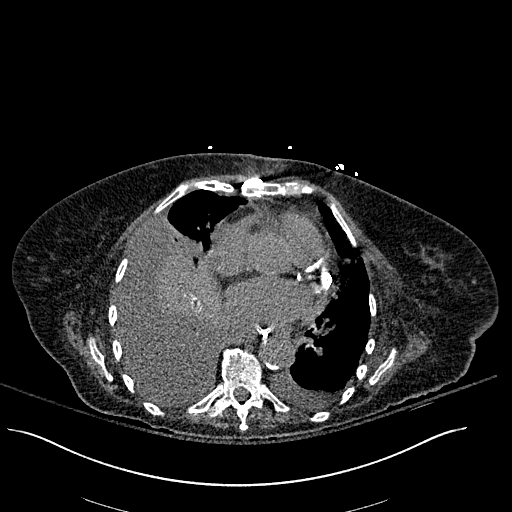
[im 81/170  lung]
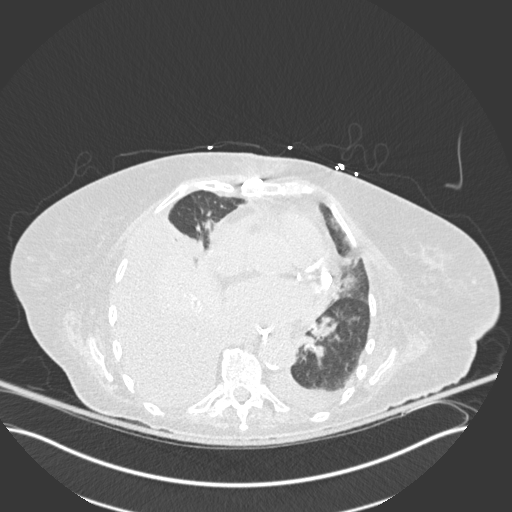
[im 89/170  lung]
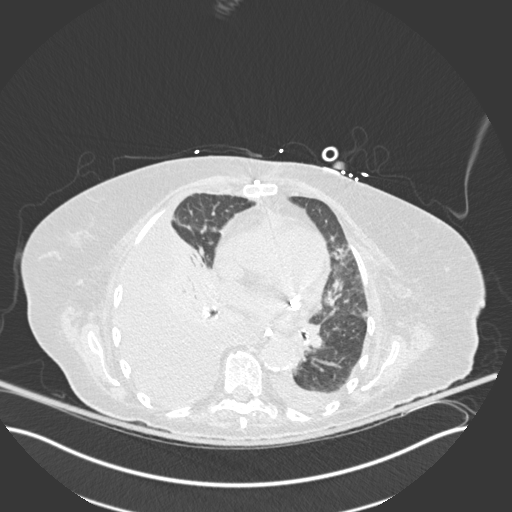
[im 107/170  lung]
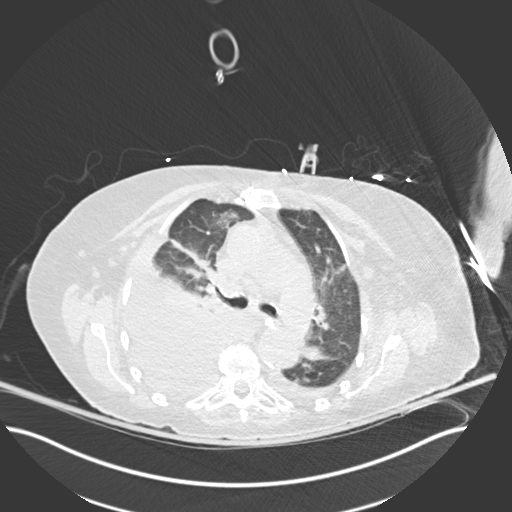
[im 125/170  lung]
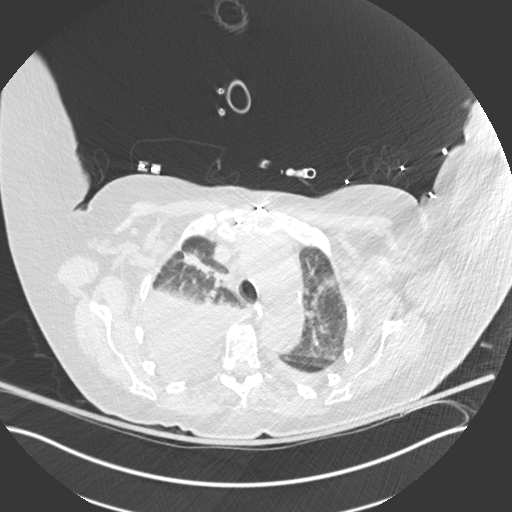
[im 143/170  mediastinal]
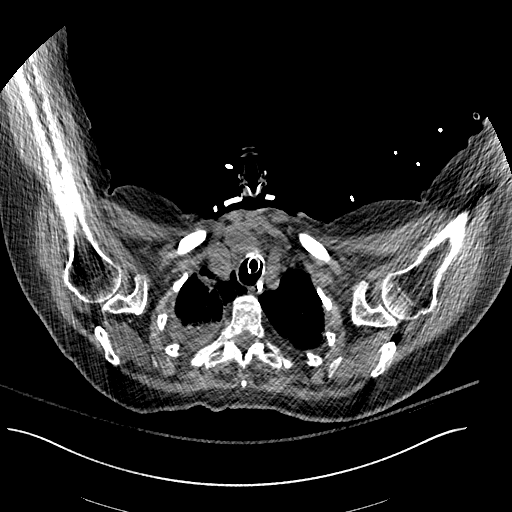
[im 143/170  lung]
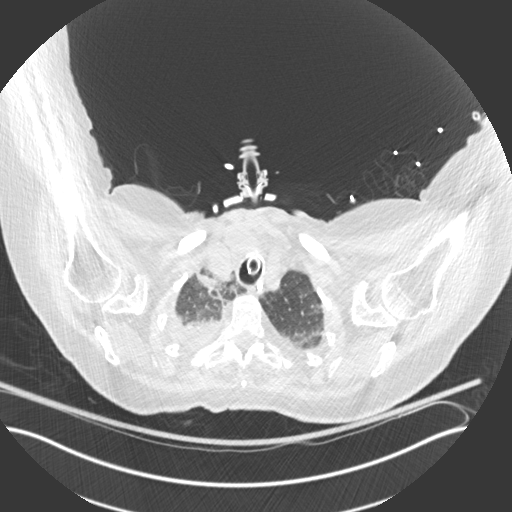
[im 161/170  lung]
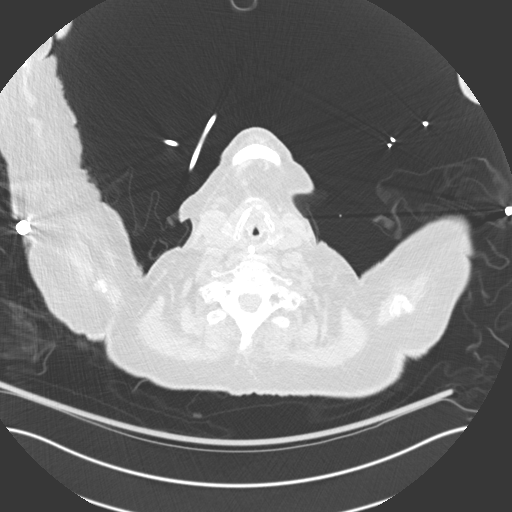

[Series 7: coronal · coronal · 0.66mm/px · 3 of 101 slices shown]
[im 21/101  lung]
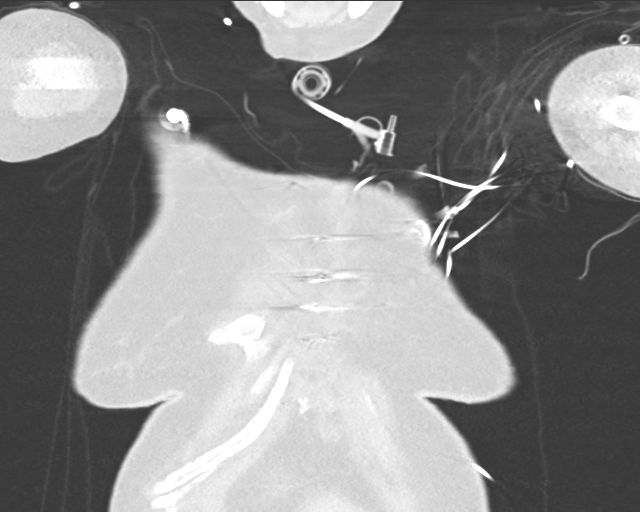
[im 41/101  lung]
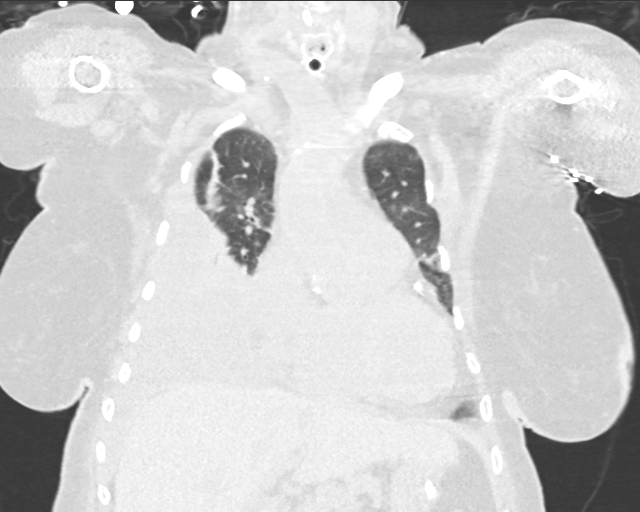
[im 61/101  lung]
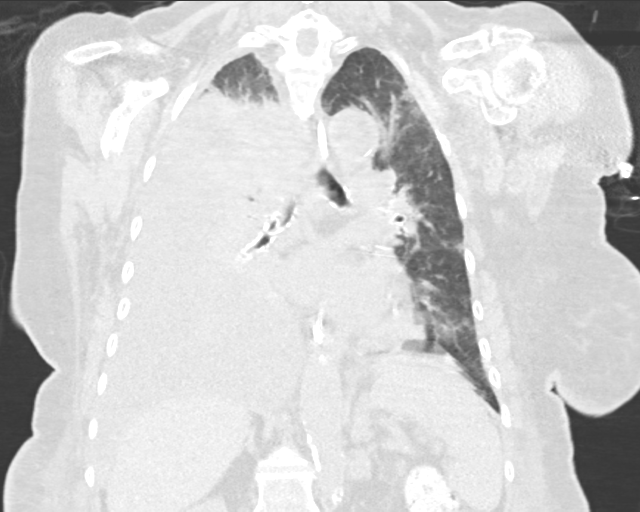

[13 of 36 positions shown; findings below may reference images not displayed]

FINDINGS: Cardiovascular: Markedly limited evaluation of the vasculature and
cardiovascular structures in the absence of contrast media. There
are postsurgical changes from recent sternotomy and reported
ascending thoracic aortic repair. The aortic root and ascending
aorta is normal caliber with more focal dilatation at the level of
the arch and proximal great vessels with aorta measuring up to
cm in diameter at this level returning to a more normal caliber of 3
cm in the proximal descending aorta. There is surrounding stranding
and fluid centered in the AP window which appears to be intermediate
attenuation (23 Hounsfield units).

Cardiac size within normal limits. Three-vessel coronary artery
atherosclerosis is seen. Prior left atrial appendage closure. Trace
pericardial effusion. Central pulmonary arteries are normal caliber.
Luminal assessment precluded.

Mediastinum/Nodes: Postsurgical changes with fluid in stranding in
the anterior mediastinum. Endotracheal tracheostomy tube tip
terminates in the mid trachea, 3.5 cm from the carina. Scattered
secretions noted in the proximal airways. A transesophageal tube is
in place with the side port just beyond the GE junction. No acute
esophageal abnormality.

Lungs/Pleura: There is a large right pleural effusion with
essentially complete atelectatic collapse of the right lower lobe
and subtotal atelectasis of the right middle lobe. Additional
bandlike opacities are present in the right upper lobe reflecting
further subsegmental atelectatic change. A small left effusion is
present with some adjacent passive atelectasis. Many of the airways
are thickened and fluid-filled including several fully opacified
airways within the atelectatic lung on the right. Some patchy areas
ground-glass and minimal consolidation and are present within the
aerated portions of the lungs. No pneumothorax.

Upper Abdomen: No acute abnormalities present in the visualized
portions of the upper abdomen.

Musculoskeletal: No acute osseous abnormality or suspicious osseous
lesion.
IMPRESSION: 1. Markedly limited evaluation of the vasculature and cardiovascular
structures in the absence of contrast media.
2. Postsurgical changes from recent sternotomy and reported
ascending thoracic aortic repair. Persistent aneurysmal dilatation
of the thoracic aortic arch to 4.6 cm with some fusiform ectasia of
the brachiocephalic artery as well. Some surrounding mild stranding
as well as nonspecific low to intermediate attenuation fluid is
noted in the AP window is feasibly postsurgical, however if further
assessment of the vascularity or surgical changes is warranted
clinically, a contrasted exam should be considered, particularly
given the absence of direct cross-sectional comparison imaging.
3. Large right pleural effusion with essentially complete
atelectatic collapse of the right lower lobe and subtotal
atelectasis of the right middle lobe. Additional small left pleural
effusion with some passive atelectasis as well.
4. Fluid-filled and thickened airways including several fully
opacified airways within the atelectatic portions of the lungs as
well as additional patchy ground-glass and consolidation within the
aerated lung could reflect a superimposed aspiration or infectious
process.
5. Three-vessel coronary artery atherosclerosis. Prior left atrial
appendage closure.
6. Endotracheal tracheostomy tube tip terminates in the mid trachea,
3.5 cm from the carina.
7. A transesophageal tube is in place with the side port just beyond
the GE junction.

These results were called by telephone at the time of interpretation
on 02/19/2020 at [DATE] to provider Dr. Viruet, who verbally
acknowledged these results.

## 2021-06-28 IMAGING — DX DG CHEST 1V PORT
1 series · 1 of 1 positions shown · non-contrast
Comparison: 02/24/2020

CLINICAL DATA: Ventilator dependent.

EXAM:
PORTABLE CHEST 1 VIEW

[chest]
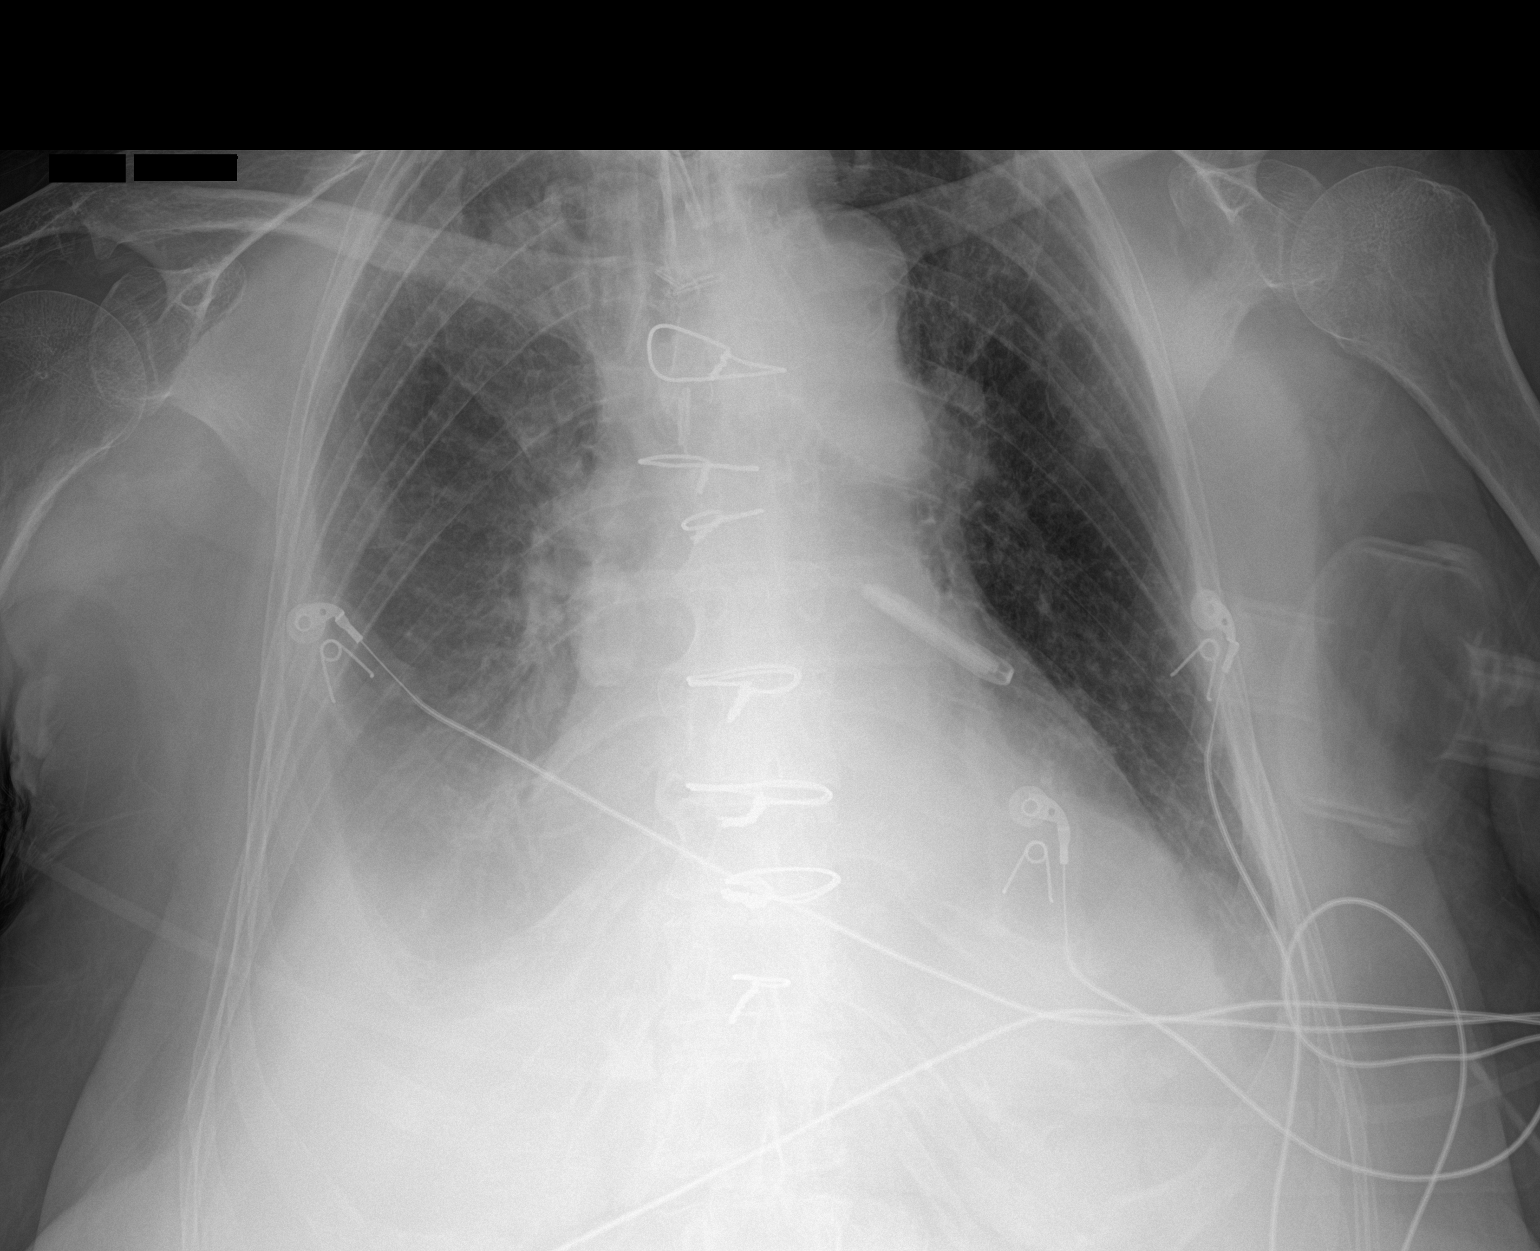

[1 of 1 positions shown; findings below may reference images not displayed]

FINDINGS: Right more than left pleural effusion, at least moderate on the
right. Chronic cardiomegaly with left atrial clipping. There is a
tracheostomy tube place. Vascular congestion has likely improved
from before.
IMPRESSION: 1. Unchanged right more than left pleural effusion and presumed
atelectasis.
2. Possible improvement in pulmonary edema/vascular congestion.
